# Patient Record
Sex: Female | Born: 1980 | Race: White | Hispanic: No | Marital: Married | Smoking: Light tobacco smoker
Health system: Southern US, Community
[De-identification: ages and names within clinical notes are randomized; demographics above are authoritative.]

## PROBLEM LIST (undated history)

## (undated) ENCOUNTER — Inpatient Hospital Stay: Payer: Self-pay

## (undated) DIAGNOSIS — F502 Bulimia nervosa, unspecified: Secondary | ICD-10-CM

## (undated) DIAGNOSIS — F1011 Alcohol abuse, in remission: Secondary | ICD-10-CM

## (undated) DIAGNOSIS — Z91410 Personal history of adult physical and sexual abuse: Secondary | ICD-10-CM

## (undated) DIAGNOSIS — R630 Anorexia: Secondary | ICD-10-CM

## (undated) DIAGNOSIS — Z8659 Personal history of other mental and behavioral disorders: Secondary | ICD-10-CM

## (undated) DIAGNOSIS — Z62810 Personal history of physical and sexual abuse in childhood: Secondary | ICD-10-CM

## (undated) DIAGNOSIS — K219 Gastro-esophageal reflux disease without esophagitis: Secondary | ICD-10-CM

## (undated) DIAGNOSIS — F429 Obsessive-compulsive disorder, unspecified: Secondary | ICD-10-CM

## (undated) DIAGNOSIS — R569 Unspecified convulsions: Secondary | ICD-10-CM

## (undated) DIAGNOSIS — D649 Anemia, unspecified: Secondary | ICD-10-CM

## (undated) DIAGNOSIS — Z8669 Personal history of other diseases of the nervous system and sense organs: Secondary | ICD-10-CM

## (undated) DIAGNOSIS — Z720 Tobacco use: Secondary | ICD-10-CM

## (undated) HISTORY — DX: Personal history of other diseases of the nervous system and sense organs: Z86.69

## (undated) HISTORY — PX: TONSILLECTOMY: SUR1361

## (undated) HISTORY — DX: Obsessive-compulsive disorder, unspecified: F42.9

## (undated) HISTORY — DX: Personal history of adult physical and sexual abuse: Z91.410

## (undated) HISTORY — PX: EYE SURGERY: SHX253

## (undated) HISTORY — DX: Personal history of physical and sexual abuse in childhood: Z62.810

## (undated) HISTORY — DX: Alcohol abuse, in remission: F10.11

## (undated) HISTORY — DX: Tobacco use: Z72.0

## (undated) HISTORY — DX: Personal history of other mental and behavioral disorders: Z86.59

---
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
## 2005-03-30 ENCOUNTER — Emergency Department: Payer: Self-pay | Admitting: Internal Medicine

## 2007-03-27 ENCOUNTER — Emergency Department: Payer: Self-pay | Admitting: Emergency Medicine

## 2007-03-29 ENCOUNTER — Ambulatory Visit: Payer: Self-pay | Admitting: Emergency Medicine

## 2008-04-06 ENCOUNTER — Observation Stay: Payer: Self-pay

## 2008-04-07 ENCOUNTER — Inpatient Hospital Stay: Payer: Self-pay

## 2008-05-27 ENCOUNTER — Emergency Department: Payer: Self-pay | Admitting: Emergency Medicine

## 2008-11-26 ENCOUNTER — Encounter: Payer: Self-pay | Admitting: Family Medicine

## 2008-12-02 ENCOUNTER — Encounter: Payer: Self-pay | Admitting: Family Medicine

## 2008-12-17 ENCOUNTER — Emergency Department: Payer: Self-pay | Admitting: Emergency Medicine

## 2009-01-01 ENCOUNTER — Encounter: Payer: Self-pay | Admitting: Family Medicine

## 2009-05-08 ENCOUNTER — Encounter: Payer: Self-pay | Admitting: Obstetrics & Gynecology

## 2009-05-22 ENCOUNTER — Encounter: Payer: Self-pay | Admitting: Obstetrics & Gynecology

## 2009-05-24 ENCOUNTER — Inpatient Hospital Stay: Payer: Self-pay | Admitting: Unknown Physician Specialty

## 2009-05-25 DIAGNOSIS — O0933 Supervision of pregnancy with insufficient antenatal care, third trimester: Secondary | ICD-10-CM

## 2009-07-24 ENCOUNTER — Ambulatory Visit: Payer: Self-pay | Admitting: Family Medicine

## 2011-07-11 ENCOUNTER — Other Ambulatory Visit: Payer: Self-pay

## 2011-07-19 ENCOUNTER — Observation Stay: Payer: Self-pay

## 2011-07-19 LAB — URINALYSIS, COMPLETE
Blood: NEGATIVE
Ketone: NEGATIVE
Leukocyte Esterase: NEGATIVE
Nitrite: POSITIVE
Ph: 6 (ref 4.5–8.0)
Protein: NEGATIVE
Specific Gravity: 1.01 (ref 1.003–1.030)
Squamous Epithelial: NONE SEEN

## 2011-07-19 LAB — DRUG SCREEN, URINE
Amphetamines, Ur Screen: NEGATIVE (ref ?–1000)
Barbiturates, Ur Screen: NEGATIVE (ref ?–200)
Benzodiazepine, Ur Scrn: NEGATIVE (ref ?–200)
Cannabinoid 50 Ng, Ur ~~LOC~~: NEGATIVE (ref ?–50)
Methadone, Ur Screen: NEGATIVE (ref ?–300)
Opiate, Ur Screen: NEGATIVE (ref ?–300)
Phencyclidine (PCP) Ur S: NEGATIVE (ref ?–25)

## 2011-09-01 ENCOUNTER — Inpatient Hospital Stay: Payer: Self-pay

## 2011-09-01 LAB — DRUG SCREEN, URINE
Barbiturates, Ur Screen: NEGATIVE (ref ?–200)
Cannabinoid 50 Ng, Ur ~~LOC~~: NEGATIVE (ref ?–50)
Cocaine Metabolite,Ur ~~LOC~~: NEGATIVE (ref ?–300)
Methadone, Ur Screen: NEGATIVE (ref ?–300)
Opiate, Ur Screen: NEGATIVE (ref ?–300)
Phencyclidine (PCP) Ur S: NEGATIVE (ref ?–25)

## 2011-09-01 LAB — CBC WITH DIFFERENTIAL/PLATELET
Basophil %: 0.3 %
HCT: 36.5 % (ref 35.0–47.0)
HGB: 12.9 g/dL (ref 12.0–16.0)
MCH: 32.9 pg (ref 26.0–34.0)
MCHC: 35.2 g/dL (ref 32.0–36.0)
MCV: 93 fL (ref 80–100)
Monocyte %: 5.2 %
Neutrophil #: 7.4 10*3/uL — ABNORMAL HIGH (ref 1.4–6.5)
Platelet: 126 10*3/uL — ABNORMAL LOW (ref 150–440)
RDW: 13.9 % (ref 11.5–14.5)

## 2011-09-02 LAB — HEMATOCRIT: HCT: 35.1 % (ref 35.0–47.0)

## 2012-02-02 DIAGNOSIS — R569 Unspecified convulsions: Secondary | ICD-10-CM

## 2012-02-02 HISTORY — DX: Unspecified convulsions: R56.9

## 2012-06-30 ENCOUNTER — Emergency Department: Payer: Self-pay | Admitting: Emergency Medicine

## 2012-07-02 ENCOUNTER — Emergency Department: Payer: Self-pay | Admitting: Emergency Medicine

## 2012-12-10 ENCOUNTER — Emergency Department: Payer: Self-pay | Admitting: Emergency Medicine

## 2012-12-10 LAB — COMPREHENSIVE METABOLIC PANEL
Anion Gap: 4 — ABNORMAL LOW (ref 7–16)
BUN: 5 mg/dL — ABNORMAL LOW (ref 7–18)
Bilirubin,Total: 0.6 mg/dL (ref 0.2–1.0)
Calcium, Total: 8.9 mg/dL (ref 8.5–10.1)
EGFR (Non-African Amer.): >60
Osmolality: 271 (ref 275–301)
Potassium: 3.5 mmol/L (ref 3.5–5.1)
SGOT(AST): 142 U/L — ABNORMAL HIGH (ref 15–37)
SGPT (ALT): 342 U/L — ABNORMAL HIGH (ref 12–78)

## 2012-12-10 LAB — URINALYSIS, COMPLETE
Glucose,UR: NEGATIVE mg/dL (ref 0–75)
Ketone: NEGATIVE
Leukocyte Esterase: NEGATIVE
Ph: 6 (ref 4.5–8.0)
Renal Epithelial: 2
Specific Gravity: 1.008 (ref 1.003–1.030)
Squamous Epithelial: 1
WBC UR: 1 /HPF (ref 0–5)

## 2012-12-10 LAB — CBC
HCT: 39.3 % (ref 35.0–47.0)
HGB: 13.8 g/dL (ref 12.0–16.0)
MCH: 34.2 pg — ABNORMAL HIGH (ref 26.0–34.0)
MCHC: 35.2 g/dL (ref 32.0–36.0)
RBC: 4.05 10*6/uL (ref 3.80–5.20)
RDW: 13.4 % (ref 11.5–14.5)

## 2013-08-20 ENCOUNTER — Emergency Department: Payer: Self-pay | Admitting: Emergency Medicine

## 2013-10-30 ENCOUNTER — Emergency Department: Payer: Self-pay | Admitting: Emergency Medicine

## 2013-10-30 LAB — URINALYSIS, COMPLETE
BILIRUBIN, UR: NEGATIVE
Glucose,UR: NEGATIVE mg/dL (ref 0–75)
KETONE: NEGATIVE
LEUKOCYTE ESTERASE: NEGATIVE
Nitrite: NEGATIVE
Ph: 6 (ref 4.5–8.0)
Protein: 30
RBC,UR: 15 /HPF (ref 0–5)
SPECIFIC GRAVITY: 1.001 (ref 1.003–1.030)
Squamous Epithelial: 101
WBC UR: 9 /HPF (ref 0–5)

## 2013-11-01 LAB — URINE CULTURE

## 2014-04-19 ENCOUNTER — Emergency Department: Payer: Self-pay | Admitting: Emergency Medicine

## 2014-05-21 NOTE — Consult Note (Signed)
    Maternal Age 34    Gravida 5    Para 3    Term Deliveries 3    Blood Type (Maternal) A positive    HIV Results (Maternal) negative    Gonorrhea Results (Maternal) negative    Chlamydia Results (Maternal) negative    Hepatitis C Culture (Maternal) unknown    Herpes Results (Maternal) n/a    VDRL/RPR/Syphilis Results (Maternal) negative    Rubella Results (Maternal) immune    Hepatitis B Surface Antigen Results (Maternal) negative    Group B Strep Results Maternal (Result >5wks must be treated as unknown) positive     Additional Comments I was consulted by OB to consult on this mother for concerns of Suboxone use during pregnancy and potential effect on the baby.  I review the medical records, and assessed mom and dad's understanding of why they are talking with a neonatologist.  Mom reports that she is taking 12mg  PO BID and not on any other prescription medications, or have any other complications with her pregnancy.  The medical record and repeated urine toxicology screen confirm this.  I discussed with the the pros and cons of pharmacotherapy during pregnancy for concerns of opiate addiction.  I explained to them the possibel long term risk of neurologic and behavioral issues associated with in-utero narcotic exposure.  I also discussed the acute complications of in-utero opiate exposure, primarily neonatal abstinence syndrome.  As mom is on a moderate dose of Suboxone, I estimated a 50/50 chance of clinically significant withdrawal symptoms by day 4-5 of life that may require pharmacologic management.  I informed her that due to Suboxone's long half-life, we will need to monitor her son for upto 5 days to assess for NAS.  I further expressed to her, that for his safety, he may best be monitored in the [facility].  As mom desires to breastfeed, she understands that this may not be ideal, but may be best for her son.  I did confirm that the Suboxone is not a contraindication to  breastfeeding.  She will try her best to be avilable or provide breast milk, as available, to nurish her son.  I expressed that if her son does need medical management for NAS, [facility].  I answered their questions.  They expressed appreciation for the consultation.  I spent 60 minutes reviwing the chart, talking with providers, and face-to-face with family.  Thank you very much for allowing me to particiapte in her care, please let me know if we can be of any additional service.    Parental Contact Parents informed at length regarding prenatal care and plan   Electronic Signatures: Torrie MayersHorowitz, Shelle Galdamez N (MD)  (Signed 31-Jul-13 13:51)  Authored: PREGNANCY and LABOR, ADDITIONAL COMMENTS   Last Updated: 31-Jul-13 13:51 by Torrie MayersHorowitz, Tukker Byrns N (MD)

## 2014-06-11 NOTE — H&P (Signed)
L&D Evaluation:  History:   HPI 34 yo G5P3 (1 set of twins) with unknown LMp approx 10/2010 [facility] here due to "low pelvic pain today", 3 episodes of diarrhea, +cramping like discomfort. No LOF, VB, decreased FM or active labor pattern. [facility]. Pt is on Suboxone and in the [facility] x 2 years for addiciton of narcs related to her back problems.    Presents with abdominal pain, lower pelvic pain, diarrhea    Patient's Medical History Drug Addiction on Suboxone, PP depression,    Patient's Surgical History none    Medications Pre Natal Vitamins  Iron    Allergies NKDA    Social History tobacco  EtOH  Last ETOH 2012, Quit tobacco 9 months ago    Family History Non-Contributory   ROS:   ROS All systems were reviewed.  HEENT, CNS, GI, GU, Respiratory, CV, Renal and Musculoskeletal systems were found to be normal.   Exam:   Vital Signs stable  109/70    Urine Protein UDS neg, Urine 1+ bact, neg leuk esterase, +nitirites    General no apparent distress    Mental Status clear    Chest clear    Heart no murmur/gallop/rubs    Abdomen gravid, non-tender    Estimated Fetal Weight Average for gestational age    Back no CVAT    Edema 1+    Reflexes 1+    Clonus negative    Mebranes Intact    FHT normal rate with no decels, reactive NST with 2 accels 15 x 15 bpm    Ucx irregular    Skin dry    Lymph no lymphadenopathy    Other Pt ate and tol without diarrhea after Immodium given. pt lives in a shelter currently. [facility] Timmothy Soursebbie Stanford, CNM   Impression:   Impression UTI, reactive NST, IUP at 32 weeks   Plan:   Plan Dc home, [facility] and [facility]   Electronic Signatures: Sharee PimpleJones, Caron W (CNM)  (Signed 17-Jun-13 21:26)  Authored: L&D Evaluation   Last Updated: 17-Jun-13 21:26 by Sharee PimpleJones, Caron W (CNM)

## 2014-06-11 NOTE — H&P (Signed)
L&D Evaluation:  History:   HPI 34 y/o G5P3013 [facility] by [facility]JS for FHR baseline 100's. Irreg mild uc's, regular fetal movement, denies leaking fluid or vaginal bleeding. [facility] at [redacted]wks gestation.Pregnancy complicated by HX of substance abuse-opiod dependence since 2008. Current use of suboxone (prior 7063yrs) 24mg QD (12mg  am and 12pm dose). HX PPD 2nd child (multiple medication and multiple stressors).GBS+    Presents with above    Patient's Medical History No Chronic Illness    Patient's Surgical History none    Allergies NKDA    Social History ETOH and cigarettes prior to pregnancy    Family History Non-Contributory   ROS:   ROS All systems were reviewed.  HEENT, CNS, GI, GU, Respiratory, CV, Renal and Musculoskeletal systems were found to be normal.   Exam:   Vital Signs stable    Urine Protein to lab    General no apparent distress    Mental Status clear    Chest clear    Heart normal sinus rhythm    Abdomen gravid, non-tender    Estimated Fetal Weight Average for gestational age    Fetal Position vtx    Fundal Height term    Back no CVAT    Edema no edema    Reflexes 1+    Clonus negative    Pelvic no external lesions, 4-5cm 80% BBOW sm show vtx @ -1    Mebranes Intact    FHT baseline 100's avg variability with accels to 110-120's    FHT Description 120    Ucx irregular    Skin dry    Lymph no lymphadenopathy   Impression:   Impression early labor, Augment early labor/ Low FHR baseline   Plan:   Plan EFM/NST, monitor contractions and for cervical change, antibiotics for GBBS prophylaxis    Comments Admitted, explained what to expect with labor/delivery/postpartum and newborn care. Has taken 1200 dose of 12mg  suboxone. IV ABX infusing, will await for AROM/Pitocin augment. Plans epidural with labor progress. Dr Feliberto GottronSchermerhorn updated on current assessment.  Neonatology consulted and Dr Abundio MiuHorowitz has been to see pt and husband Reuel BoomDaniel @ bedside..Will order social services consult. Continue close observation per CNM.   Electronic Signatures: Albertina ParrLugiano, Treyveon Mochizuki B (CNM)  (Signed 31-Jul-13 13:32)  Authored: L&D Evaluation   Last Updated: 31-Jul-13 13:32 by Albertina ParrLugiano, Talaysha Freeberg B (CNM)

## 2014-07-30 ENCOUNTER — Other Ambulatory Visit: Payer: Self-pay | Admitting: Advanced Practice Midwife

## 2014-07-30 DIAGNOSIS — F17200 Nicotine dependence, unspecified, uncomplicated: Secondary | ICD-10-CM

## 2014-07-30 DIAGNOSIS — F1010 Alcohol abuse, uncomplicated: Secondary | ICD-10-CM

## 2014-07-31 LAB — OB RESULTS CONSOLE GC/CHLAMYDIA
CHLAMYDIA, DNA PROBE: NEGATIVE
Gonorrhea: NEGATIVE

## 2014-07-31 LAB — OB RESULTS CONSOLE VARICELLA ZOSTER ANTIBODY, IGG: VARICELLA IGG: IMMUNE

## 2014-07-31 LAB — OB RESULTS CONSOLE RUBELLA ANTIBODY, IGM: RUBELLA: IMMUNE

## 2014-07-31 LAB — OB RESULTS CONSOLE HGB/HCT, BLOOD
HCT: 38 %
Hemoglobin: 12.7 g/dL

## 2014-07-31 LAB — OB RESULTS CONSOLE HIV ANTIBODY (ROUTINE TESTING): HIV: NONREACTIVE

## 2014-07-31 LAB — OB RESULTS CONSOLE ABO/RH: RH Type: POSITIVE

## 2014-07-31 LAB — OB RESULTS CONSOLE RPR: RPR: NONREACTIVE

## 2014-07-31 LAB — OB RESULTS CONSOLE HEPATITIS B SURFACE ANTIGEN: HEP B S AG: NEGATIVE

## 2014-07-31 LAB — OB RESULTS CONSOLE ANTIBODY SCREEN: ANTIBODY SCREEN: NEGATIVE

## 2014-07-31 LAB — OB RESULTS CONSOLE PLATELET COUNT: Platelets: 178 10*3/uL

## 2014-08-22 ENCOUNTER — Ambulatory Visit
Admission: RE | Admit: 2014-08-22 | Discharge: 2014-08-22 | Disposition: A | Discharge status: Home / Self Care | Payer: Medicaid Other | Source: Ambulatory Visit | Attending: Maternal & Fetal Medicine | Admitting: Maternal & Fetal Medicine

## 2014-08-22 DIAGNOSIS — O99332 Smoking (tobacco) complicating pregnancy, second trimester: Secondary | ICD-10-CM | POA: Diagnosis not present

## 2014-08-22 DIAGNOSIS — F17200 Nicotine dependence, unspecified, uncomplicated: Secondary | ICD-10-CM

## 2014-08-22 DIAGNOSIS — F1010 Alcohol abuse, uncomplicated: Secondary | ICD-10-CM

## 2014-08-22 DIAGNOSIS — Z3A16 16 weeks gestation of pregnancy: Secondary | ICD-10-CM | POA: Diagnosis not present

## 2014-08-22 HISTORY — DX: Bulimia nervosa, unspecified: F50.20

## 2014-08-22 HISTORY — DX: Anorexia: R63.0

## 2014-08-22 HISTORY — DX: Bulimia nervosa: F50.2

## 2014-08-22 LAB — US OB DETAIL + 14 WK

## 2014-08-27 ENCOUNTER — Encounter: Payer: Self-pay | Admitting: Obstetrics and Gynecology

## 2014-09-05 ENCOUNTER — Inpatient Hospital Stay: Admission: RE | Admit: 2014-09-05 | Payer: Medicaid Other | Source: Ambulatory Visit

## 2014-09-18 ENCOUNTER — Encounter: Payer: Medicaid Other | Admitting: Obstetrics and Gynecology

## 2014-09-19 ENCOUNTER — Ambulatory Visit (INDEPENDENT_AMBULATORY_CARE_PROVIDER_SITE_OTHER): Payer: Medicaid Other | Admitting: Obstetrics and Gynecology

## 2014-09-19 ENCOUNTER — Encounter: Payer: Self-pay | Admitting: Obstetrics and Gynecology

## 2014-09-19 VITALS — BP 116/80 | HR 60 | Wt 139.9 lb

## 2014-09-19 DIAGNOSIS — F5000 Anorexia nervosa, unspecified: Secondary | ICD-10-CM | POA: Insufficient documentation

## 2014-09-19 DIAGNOSIS — Z8679 Personal history of other diseases of the circulatory system: Secondary | ICD-10-CM

## 2014-09-19 DIAGNOSIS — O219 Vomiting of pregnancy, unspecified: Secondary | ICD-10-CM | POA: Insufficient documentation

## 2014-09-19 DIAGNOSIS — O0992 Supervision of high risk pregnancy, unspecified, second trimester: Secondary | ICD-10-CM

## 2014-09-19 DIAGNOSIS — O9989 Other specified diseases and conditions complicating pregnancy, childbirth and the puerperium: Secondary | ICD-10-CM

## 2014-09-19 DIAGNOSIS — F42 Obsessive-compulsive disorder: Secondary | ICD-10-CM

## 2014-09-19 DIAGNOSIS — Z720 Tobacco use: Secondary | ICD-10-CM

## 2014-09-19 DIAGNOSIS — F429 Obsessive-compulsive disorder, unspecified: Secondary | ICD-10-CM

## 2014-09-19 DIAGNOSIS — Z62810 Personal history of physical and sexual abuse in childhood: Secondary | ICD-10-CM

## 2014-09-19 DIAGNOSIS — F1210 Cannabis abuse, uncomplicated: Secondary | ICD-10-CM

## 2014-09-19 DIAGNOSIS — Z315 Encounter for genetic counseling: Secondary | ICD-10-CM

## 2014-09-19 DIAGNOSIS — F1010 Alcohol abuse, uncomplicated: Secondary | ICD-10-CM

## 2014-09-19 DIAGNOSIS — F1011 Alcohol abuse, in remission: Secondary | ICD-10-CM | POA: Insufficient documentation

## 2014-09-19 DIAGNOSIS — Z91410 Personal history of adult physical and sexual abuse: Secondary | ICD-10-CM

## 2014-09-19 DIAGNOSIS — O0990 Supervision of high risk pregnancy, unspecified, unspecified trimester: Secondary | ICD-10-CM | POA: Insufficient documentation

## 2014-09-19 DIAGNOSIS — IMO0002 Reserved for concepts with insufficient information to code with codable children: Secondary | ICD-10-CM

## 2014-09-19 DIAGNOSIS — Z8669 Personal history of other diseases of the nervous system and sense organs: Secondary | ICD-10-CM

## 2014-09-19 DIAGNOSIS — Z331 Pregnant state, incidental: Secondary | ICD-10-CM

## 2014-09-19 DIAGNOSIS — Z8659 Personal history of other mental and behavioral disorders: Secondary | ICD-10-CM

## 2014-09-19 HISTORY — DX: Personal history of other mental and behavioral disorders: Z86.59

## 2014-09-19 HISTORY — DX: Personal history of other diseases of the nervous system and sense organs: Z86.69

## 2014-09-19 HISTORY — DX: Personal history of physical and sexual abuse in childhood: Z62.810

## 2014-09-19 HISTORY — DX: Tobacco use: Z72.0

## 2014-09-19 HISTORY — DX: Alcohol abuse, in remission: F10.11

## 2014-09-19 HISTORY — DX: Personal history of adult physical and sexual abuse: Z91.410

## 2014-09-19 HISTORY — DX: Obsessive-compulsive disorder, unspecified: F42.9

## 2014-09-19 LAB — POCT URINALYSIS DIPSTICK
Bilirubin, UA: NEGATIVE
Blood, UA: NEGATIVE
GLUCOSE UA: NEGATIVE
KETONES UA: NEGATIVE
Leukocytes, UA: NEGATIVE
Nitrite, UA: NEGATIVE
Protein, UA: NEGATIVE
SPEC GRAV UA: 1.015
UROBILINOGEN UA: 0.2
pH, UA: 6.5

## 2014-09-19 MED ORDER — ONDANSETRON 4 MG PO TBDP: 4.0000 mg | ORAL_TABLET | Freq: Four times a day (QID) | ORAL | Status: AC | PRN

## 2014-09-19 MED ORDER — DOXYLAMINE-PYRIDOXINE 10-10 MG PO TBEC: 2.0000 | DELAYED_RELEASE_TABLET | Freq: Every day | ORAL | Status: DC
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
## 2014-09-25 ENCOUNTER — Other Ambulatory Visit: Payer: Medicaid Other

## 2014-09-25 LAB — AFP, QUAD SCREEN
DIA MOM VALUE: 1.19
DIA VALUE (EIA): 261.12 pg/mL
DSR (By Age)    1 IN: 436
DSR (Second Trimester) 1 IN: 5015
GESTATIONAL AGE AFP: 20.3 wk
MATERNAL AGE AT EDD: 33.1 a
MSAFP Mom: 1.4
MSAFP: 84.9 ng/mL
MSHCG Mom: 0.73
MSHCG: 17948 m[IU]/mL
Osb Risk: 3600
T18 (By Age): 1:1698 {titer}
Test Results:: NEGATIVE
UE3 MOM: 1.31
Weight: 139 [lb_av]
uE3 Value: 2.71 ng/mL

## 2014-09-30 ENCOUNTER — Telehealth: Payer: Self-pay

## 2014-09-30 NOTE — Telephone Encounter (Signed)
-----   Message from Hildred Laser, MD sent at 09/26/2014 11:34 AM EDT ----- Please inform patient of normal 2nd trimester screen

## 2014-09-30 NOTE — Telephone Encounter (Signed)
Pt notifed.

## 2014-10-11 ENCOUNTER — Ambulatory Visit: Payer: Medicaid Other

## 2014-10-11 DIAGNOSIS — IMO0002 Reserved for concepts with insufficient information to code with codable children: Secondary | ICD-10-CM

## 2014-10-11 DIAGNOSIS — Z315 Encounter for genetic counseling: Secondary | ICD-10-CM

## 2014-10-24 ENCOUNTER — Encounter: Payer: Medicaid Other | Admitting: Obstetrics and Gynecology

## 2014-10-29 ENCOUNTER — Observation Stay
Admission: EM | Admit: 2014-10-29 | Discharge: 2014-10-29 | Disposition: A | Discharge status: Home / Self Care | Payer: Medicaid Other | Attending: Obstetrics and Gynecology | Admitting: Obstetrics and Gynecology

## 2014-10-29 ENCOUNTER — Encounter: Payer: Medicaid Other | Admitting: Obstetrics and Gynecology

## 2014-10-29 DIAGNOSIS — Z3A26 26 weeks gestation of pregnancy: Secondary | ICD-10-CM | POA: Insufficient documentation

## 2014-10-29 LAB — URINALYSIS COMPLETE WITH MICROSCOPIC (ARMC ONLY)
BILIRUBIN URINE: NEGATIVE
Bacteria, UA: NONE SEEN
GLUCOSE, UA: NEGATIVE mg/dL
Ketones, ur: NEGATIVE mg/dL
Leukocytes, UA: NEGATIVE
Nitrite: NEGATIVE
PH: 5 (ref 5.0–8.0)
Protein, ur: NEGATIVE mg/dL
Specific Gravity, Urine: 1.019 (ref 1.005–1.030)

## 2014-10-29 MED ORDER — ONDANSETRON HCL 4 MG PO TABS
4.0000 mg | ORAL_TABLET | Freq: Once | ORAL | Status: AC
  Administered 2014-10-29: 4 mg via ORAL

## 2014-10-29 MED ORDER — ONDANSETRON HCL 4 MG PO TABS
ORAL_TABLET | ORAL | Status: AC
  Administered 2014-10-29: 4 mg via ORAL
  Filled 2014-10-29: qty 1

## 2014-10-29 MED ORDER — BISMUTH SUBSALICYLATE 262 MG/15ML PO SUSP
30.0000 mL | Freq: Once | ORAL | Status: AC
  Administered 2014-10-29: 30 mL via ORAL
  Filled 2014-10-29: qty 118

## 2014-10-29 NOTE — OB Triage Note (Signed)
Pt. reports sharp lower abdominal pain radiating "from the sides;" has been having irregular contractions. Reports nausea, vomiting, and diarrhea beginning yesterday and worsening today.

## 2014-11-01 ENCOUNTER — Emergency Department
Admission: EM | Admit: 2014-11-01 | Discharge: 2014-11-01 | Disposition: A | Discharge status: Home / Self Care | Payer: Medicaid Other | Attending: Student | Admitting: Student

## 2014-11-01 ENCOUNTER — Encounter: Payer: Self-pay | Admitting: *Deleted

## 2014-11-01 DIAGNOSIS — L02413 Cutaneous abscess of right upper limb: Secondary | ICD-10-CM | POA: Diagnosis not present

## 2014-11-01 DIAGNOSIS — F17210 Nicotine dependence, cigarettes, uncomplicated: Secondary | ICD-10-CM | POA: Insufficient documentation

## 2014-11-01 DIAGNOSIS — O99712 Diseases of the skin and subcutaneous tissue complicating pregnancy, second trimester: Secondary | ICD-10-CM | POA: Diagnosis present

## 2014-11-01 DIAGNOSIS — L03113 Cellulitis of right upper limb: Secondary | ICD-10-CM

## 2014-11-01 DIAGNOSIS — Z79899 Other long term (current) drug therapy: Secondary | ICD-10-CM | POA: Diagnosis not present

## 2014-11-01 DIAGNOSIS — Z3A27 27 weeks gestation of pregnancy: Secondary | ICD-10-CM | POA: Diagnosis not present

## 2014-11-01 DIAGNOSIS — O99332 Smoking (tobacco) complicating pregnancy, second trimester: Secondary | ICD-10-CM | POA: Insufficient documentation

## 2014-11-01 MED ORDER — CLINDAMYCIN HCL 150 MG PO CAPS: 300.0000 mg | ORAL_CAPSULE | Freq: Four times a day (QID) | ORAL | Status: AC

## 2014-11-01 MED ORDER — CLINDAMYCIN HCL 150 MG PO CAPS
300.0000 mg | ORAL_CAPSULE | Freq: Once | ORAL | Status: AC
  Administered 2014-11-01: 300 mg via ORAL
  Filled 2014-11-01: qty 2
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
## 2014-11-01 NOTE — ED Notes (Signed)
Pt [redacted] weeks pregnant.

## 2014-11-01 NOTE — ED Notes (Signed)
Pt reports boil to left arm, had drained at [facility] last night, but pt unable to afford antibiotics.

## 2014-11-01 NOTE — ED Notes (Signed)
Pt seen and assessed by provider see provider notes for full details. 

## 2014-11-01 NOTE — Discharge Instructions (Signed)

## 2014-11-01 NOTE — ED Notes (Signed)
AAOx3.  Skin warm and dry.  NAD 

## 2014-11-07 ENCOUNTER — Ambulatory Visit (INDEPENDENT_AMBULATORY_CARE_PROVIDER_SITE_OTHER): Payer: Medicaid Other | Admitting: Obstetrics and Gynecology

## 2014-11-07 VITALS — BP 109/74 | HR 91 | Temp 98.4°F | Wt 146.3 lb

## 2014-11-07 DIAGNOSIS — O219 Vomiting of pregnancy, unspecified: Secondary | ICD-10-CM

## 2014-11-07 DIAGNOSIS — Z23 Encounter for immunization: Secondary | ICD-10-CM | POA: Diagnosis not present

## 2014-11-07 DIAGNOSIS — O0992 Supervision of high risk pregnancy, unspecified, second trimester: Secondary | ICD-10-CM

## 2014-11-07 LAB — POCT URINALYSIS DIP (MANUAL ENTRY)
BILIRUBIN UA: NEGATIVE
BILIRUBIN UA: NEGATIVE
GLUCOSE UA: NEGATIVE
Leukocytes, UA: NEGATIVE
NITRITE UA: NEGATIVE
PH UA: 6
Protein Ur, POC: NEGATIVE
RBC UA: NEGATIVE
SPEC GRAV UA: 1.015
Urobilinogen, UA: 0.2

## 2014-11-07 NOTE — Progress Notes (Signed)
ROB: Patient has complaints today of stomach pain. She needs a refill on Zofran, pre natal vitamins.

## 2014-11-07 NOTE — Progress Notes (Signed)
ROB: Patient still noting nausea and vomiting.  Desires refill on Zofran.  Refill given. Does notes some uterine irritability.  Advised on increasing hydration as much as tolerted.  RTC in 2 weeks.  For glucola at that time.  Also for flu vaccine next visit.

## 2014-11-18 ENCOUNTER — Telehealth: Payer: Self-pay

## 2014-11-18 ENCOUNTER — Encounter: Payer: Self-pay | Admitting: Obstetrics and Gynecology

## 2014-11-18 NOTE — Telephone Encounter (Signed)
Pt calls stating her lower extremeties: legs, ankles and feet are very swollen, face (cheeks) swollen and hands slightly swollen. No c/o headache, n/v. Has a little dizziness. Was seeing spots last night.  Pt to come in at 11:00am today for appt.

## 2014-11-21 ENCOUNTER — Other Ambulatory Visit: Payer: Medicaid Other

## 2014-11-21 ENCOUNTER — Encounter: Payer: Medicaid Other | Admitting: Obstetrics and Gynecology

## 2014-11-21 ENCOUNTER — Ambulatory Visit (INDEPENDENT_AMBULATORY_CARE_PROVIDER_SITE_OTHER): Payer: Medicaid Other | Admitting: Obstetrics and Gynecology

## 2014-11-21 VITALS — BP 107/65 | HR 102 | Wt 147.9 lb

## 2014-11-21 DIAGNOSIS — O219 Vomiting of pregnancy, unspecified: Secondary | ICD-10-CM

## 2014-11-21 DIAGNOSIS — O0992 Supervision of high risk pregnancy, unspecified, second trimester: Secondary | ICD-10-CM

## 2014-11-21 LAB — POCT URINALYSIS DIP (MANUAL ENTRY)
BILIRUBIN UA: NEGATIVE
BILIRUBIN UA: NEGATIVE
Blood, UA: NEGATIVE
GLUCOSE UA: NEGATIVE
LEUKOCYTES UA: NEGATIVE
NITRITE UA: NEGATIVE
PH UA: 7
Protein Ur, POC: NEGATIVE
Spec Grav, UA: <=1.005
Urobilinogen, UA: 0.2

## 2014-11-21 NOTE — Progress Notes (Signed)
ROB: Denies complaints.  Still has nausea/vomiting, but controlled with Zofran.  Arrived late, so could not complete 1 hr glucola today.  Rescheduled for next week.  RTC in 2 weeks. Declines flu vaccine.

## 2014-11-22 ENCOUNTER — Other Ambulatory Visit: Payer: Self-pay

## 2014-11-22 MED ORDER — PRENATAL VITAMINS 0.8 MG PO TABS: 1.0000 | ORAL_TABLET | Freq: Every day | ORAL | Status: DC

## 2014-11-22 MED ORDER — DOXYLAMINE SUCCINATE (SLEEP) 25 MG PO TABS: 12.5000 mg | ORAL_TABLET | Freq: Every evening | ORAL | Status: DC | PRN

## 2014-11-22 MED ORDER — VITAMIN B-6 25 MG PO TABS: 25.0000 mg | ORAL_TABLET | Freq: Every day | ORAL | Status: DC

## 2014-11-22 NOTE — Telephone Encounter (Signed)
Pt's medicaid will not cover diclegis- will erx unisom and b6. Pt also wanted a rx for pnv- .done.

## 2014-11-26 ENCOUNTER — Other Ambulatory Visit: Payer: Medicaid Other

## 2014-12-04 ENCOUNTER — Ambulatory Visit (INDEPENDENT_AMBULATORY_CARE_PROVIDER_SITE_OTHER): Payer: Medicaid Other | Admitting: Obstetrics and Gynecology

## 2014-12-04 VITALS — BP 94/57 | HR 123 | Wt 149.7 lb

## 2014-12-04 DIAGNOSIS — O0933 Supervision of pregnancy with insufficient antenatal care, third trimester: Secondary | ICD-10-CM | POA: Diagnosis not present

## 2014-12-04 DIAGNOSIS — Z23 Encounter for immunization: Secondary | ICD-10-CM

## 2014-12-04 DIAGNOSIS — Z0189 Encounter for other specified special examinations: Secondary | ICD-10-CM

## 2014-12-04 DIAGNOSIS — Z0283 Encounter for blood-alcohol and blood-drug test: Secondary | ICD-10-CM

## 2014-12-04 LAB — POCT URINALYSIS DIPSTICK
Bilirubin, UA: NEGATIVE
GLUCOSE UA: NEGATIVE
KETONES UA: NEGATIVE
Nitrite, UA: NEGATIVE
PROTEIN UA: NEGATIVE
RBC UA: NEGATIVE
SPEC GRAV UA: 1.015
UROBILINOGEN UA: NEGATIVE
pH, UA: 7.5

## 2014-12-04 MED ORDER — PRENATAL VITAMINS 0.8 MG PO TABS: 1.0000 | ORAL_TABLET | Freq: Every day | ORAL | Status: AC

## 2014-12-04 NOTE — Progress Notes (Signed)
ROB: Reports complaints of Braxton Hicks. Patient with frazzled, slightly disheveled appearance, unable to concentrate for lengthy period of time, with diaphoresis.  Will order UDS.  Arrived late again, could not complete glucola. Plans to perform in 2 days.  Now desires flu vaccine (previously declined), will administer  RTC in 2 weeks for next visit.

## 2014-12-06 ENCOUNTER — Other Ambulatory Visit: Payer: Medicaid Other

## 2014-12-07 LAB — PAIN MGT SCRN (14 DRUGS), UR
Amphetamine Screen, Ur: NEGATIVE ng/mL
BARBITURATE SCRN UR: NEGATIVE ng/mL
BUPRENORPHINE, URINE: NEGATIVE ng/mL
Benzodiazepine Screen, Urine: NEGATIVE ng/mL
CREATININE(CRT), U: 95.4 mg/dL (ref 20.0–300.0)
Cannabinoids Ur Ql Scn: NEGATIVE ng/mL
Cocaine(Metab.)Screen, Urine: NEGATIVE ng/mL
Fentanyl, Urine: NEGATIVE pg/mL
METHADONE SCREEN, URINE: NEGATIVE ng/mL
Meperidine Screen, Urine: NEGATIVE ng/mL
Opiate Scrn, Ur: NEGATIVE ng/mL
Oxycodone+Oxymorphone Ur Ql Scn: NEGATIVE ng/mL
PCP Scrn, Ur: NEGATIVE ng/mL
PH UR, DRUG SCRN: 7.2 (ref 4.5–8.9)
Propoxyphene, Screen: NEGATIVE ng/mL
Tramadol Ur Ql Scn: NEGATIVE ng/mL

## 2014-12-07 LAB — NICOTINE SCREEN, URINE: COTININE UR QL SCN: NEGATIVE ng/mL

## 2014-12-13 ENCOUNTER — Other Ambulatory Visit: Payer: Medicaid Other

## 2014-12-13 DIAGNOSIS — O0992 Supervision of high risk pregnancy, unspecified, second trimester: Secondary | ICD-10-CM

## 2014-12-14 LAB — HEMOGLOBIN AND HEMATOCRIT, BLOOD
HEMOGLOBIN: 10.9 g/dL — AB (ref 11.1–15.9)
Hematocrit: 32.1 % — ABNORMAL LOW (ref 34.0–46.6)

## 2014-12-14 LAB — GLUCOSE, 1 HOUR GESTATIONAL: Gestational Diabetes Screen: 76 mg/dL (ref 65–139)

## 2014-12-19 ENCOUNTER — Encounter: Payer: Medicaid Other | Admitting: Obstetrics and Gynecology

## 2014-12-24 ENCOUNTER — Encounter: Payer: Medicaid Other | Admitting: Obstetrics and Gynecology

## 2014-12-31 ENCOUNTER — Ambulatory Visit (INDEPENDENT_AMBULATORY_CARE_PROVIDER_SITE_OTHER): Payer: Medicaid Other | Admitting: Obstetrics and Gynecology

## 2014-12-31 VITALS — BP 97/61 | HR 100 | Wt 152.0 lb

## 2014-12-31 DIAGNOSIS — Z36 Encounter for antenatal screening of mother: Secondary | ICD-10-CM

## 2014-12-31 DIAGNOSIS — Z3483 Encounter for supervision of other normal pregnancy, third trimester: Secondary | ICD-10-CM

## 2014-12-31 DIAGNOSIS — Z3493 Encounter for supervision of normal pregnancy, unspecified, third trimester: Secondary | ICD-10-CM

## 2014-12-31 DIAGNOSIS — Z113 Encounter for screening for infections with a predominantly sexual mode of transmission: Secondary | ICD-10-CM

## 2014-12-31 DIAGNOSIS — Z369 Encounter for antenatal screening, unspecified: Secondary | ICD-10-CM

## 2014-12-31 LAB — POCT URINALYSIS DIPSTICK
Bilirubin, UA: NEGATIVE
Blood, UA: NEGATIVE
GLUCOSE UA: NEGATIVE
Ketones, UA: NEGATIVE
LEUKOCYTES UA: NEGATIVE
NITRITE UA: NEGATIVE
PROTEIN UA: NEGATIVE
SPEC GRAV UA: 1.02
UROBILINOGEN UA: NEGATIVE
pH, UA: 6

## 2014-12-31 NOTE — Progress Notes (Signed)
ROB: Patient notes that she was having spotting approximately 1-2 weeks ago.  Denies passage of clots.  Also notes contractions.  36 week labs done today. Cervix with external os 3 cm, but internal closed Glucola normal.  RTC in 1 week.  PTL precautions given.

## 2015-01-01 NOTE — Addendum Note (Signed)
Addended by: Debbe BalesGLANTON, Lynda Capistran C on: 01/01/2015 12:16 PM   Modules accepted: Orders

## 2015-01-03 LAB — GC/CHLAMYDIA PROBE AMP
Chlamydia trachomatis, NAA: NEGATIVE
NEISSERIA GONORRHOEAE BY PCR: NEGATIVE

## 2015-01-05 LAB — CULTURE, BETA STREP (GROUP B ONLY): Strep Gp B Culture: NEGATIVE

## 2015-01-09 ENCOUNTER — Ambulatory Visit (INDEPENDENT_AMBULATORY_CARE_PROVIDER_SITE_OTHER): Payer: Medicaid Other | Admitting: Obstetrics and Gynecology

## 2015-01-09 VITALS — BP 112/73 | HR 114 | Wt 152.4 lb

## 2015-01-09 DIAGNOSIS — Z720 Tobacco use: Secondary | ICD-10-CM

## 2015-01-09 DIAGNOSIS — Z3483 Encounter for supervision of other normal pregnancy, third trimester: Secondary | ICD-10-CM

## 2015-01-09 DIAGNOSIS — O219 Vomiting of pregnancy, unspecified: Secondary | ICD-10-CM

## 2015-01-09 DIAGNOSIS — Z3493 Encounter for supervision of normal pregnancy, unspecified, third trimester: Secondary | ICD-10-CM

## 2015-01-09 LAB — POCT URINALYSIS DIPSTICK
Bilirubin, UA: NEGATIVE
Blood, UA: NEGATIVE
Glucose, UA: NEGATIVE
Ketones, UA: NEGATIVE
LEUKOCYTES UA: NEGATIVE
NITRITE UA: NEGATIVE
PH UA: 6.5
PROTEIN UA: NEGATIVE
Spec Grav, UA: 1.015
Urobilinogen, UA: NEGATIVE

## 2015-01-09 NOTE — Progress Notes (Signed)
ROB: Patient denies major complaints.  36 week labs negative.  PTL precautions given.  36 week labs negative. Notes that she needs a refill on Zofran due to vomiting. Refill ordered. RTC in 1 week.

## 2015-01-16 ENCOUNTER — Encounter: Payer: Self-pay | Admitting: Obstetrics and Gynecology

## 2015-01-16 ENCOUNTER — Encounter: Payer: Medicaid Other | Admitting: Obstetrics and Gynecology

## 2015-01-16 ENCOUNTER — Ambulatory Visit (INDEPENDENT_AMBULATORY_CARE_PROVIDER_SITE_OTHER): Payer: Medicaid Other | Admitting: Obstetrics and Gynecology

## 2015-01-16 VITALS — BP 117/83 | HR 87 | Wt 148.0 lb

## 2015-01-16 DIAGNOSIS — Z720 Tobacco use: Secondary | ICD-10-CM

## 2015-01-16 DIAGNOSIS — Z3493 Encounter for supervision of normal pregnancy, unspecified, third trimester: Secondary | ICD-10-CM

## 2015-01-16 LAB — POCT URINALYSIS DIPSTICK
Bilirubin, UA: NEGATIVE
GLUCOSE UA: NEGATIVE
Ketone: NEGATIVE
Leukocytes, UA: NEGATIVE
NITRITE UA: NEGATIVE
Protein, UA: NEGATIVE
RBC UA: NEGATIVE
Spec Grav, UA: 1.01
UROBILINOGEN UA: 0.2
pH, UA: 8.5

## 2015-01-16 NOTE — Progress Notes (Signed)
No complaints. gbs and g/c- neg. Smoking cessation encouraged.While smoking 4-5 cigarettes a day, I have advised her regarding the negative impact of Tobacco on pregnancy.

## 2015-01-23 ENCOUNTER — Encounter: Payer: Self-pay | Admitting: Obstetrics and Gynecology

## 2015-01-23 ENCOUNTER — Ambulatory Visit (INDEPENDENT_AMBULATORY_CARE_PROVIDER_SITE_OTHER): Payer: Medicaid Other | Admitting: Obstetrics and Gynecology

## 2015-01-23 VITALS — BP 107/71 | HR 109 | Wt 152.6 lb

## 2015-01-23 DIAGNOSIS — O329XX0 Maternal care for malpresentation of fetus, unspecified, not applicable or unspecified: Secondary | ICD-10-CM

## 2015-01-23 DIAGNOSIS — Z3403 Encounter for supervision of normal first pregnancy, third trimester: Secondary | ICD-10-CM

## 2015-01-23 LAB — POCT URINALYSIS DIPSTICK
BILIRUBIN UA: NEGATIVE
Blood, UA: NEGATIVE
GLUCOSE UA: NEGATIVE
KETONES UA: NEGATIVE
LEUKOCYTES UA: NEGATIVE
Nitrite, UA: NEGATIVE
PROTEIN UA: NEGATIVE
Spec Grav, UA: 1.01
Urobilinogen, UA: 0.2
pH, UA: 6.5

## 2015-01-23 NOTE — Progress Notes (Signed)
Back pains x 2 d. Breech by Thayer OhmLeopold maneuvers; patient to report to labor and delivery 12/23 for external cephalic version.

## 2015-01-30 ENCOUNTER — Ambulatory Visit (INDEPENDENT_AMBULATORY_CARE_PROVIDER_SITE_OTHER): Payer: Medicaid Other | Admitting: Obstetrics and Gynecology

## 2015-01-30 VITALS — BP 106/70 | HR 87 | Wt 150.8 lb

## 2015-01-30 DIAGNOSIS — O321XX0 Maternal care for breech presentation, not applicable or unspecified: Secondary | ICD-10-CM

## 2015-01-30 DIAGNOSIS — Z3493 Encounter for supervision of normal pregnancy, unspecified, third trimester: Secondary | ICD-10-CM

## 2015-01-30 DIAGNOSIS — O26843 Uterine size-date discrepancy, third trimester: Secondary | ICD-10-CM

## 2015-01-30 LAB — POCT URINALYSIS DIPSTICK
BILIRUBIN UA: NEGATIVE
Blood, UA: NEGATIVE
Glucose, UA: NEGATIVE
Ketones, UA: NEGATIVE
LEUKOCYTES UA: NEGATIVE
NITRITE UA: NEGATIVE
PH UA: 7.5
PROTEIN UA: NEGATIVE
Spec Grav, UA: 1.01
Urobilinogen, UA: NEGATIVE

## 2015-01-30 NOTE — Progress Notes (Signed)
ROB: Patient denies complaints.  Did not report to L&D last week for cephalic version as previously scheduled.  Also noting significant size/dates discrepancy.  Limited unofficial sono performed today notes breech presentation (back up, appears to be frank), however EGA measuring at ~ 36 weeks, and AFI ~ 5 cm.  Will have patient scheduled for official sono tomorrow, based on findings, can determine further management.  Patient strongly desires to try version and avoid C-section if possible; if patient is still a candidate for version, will schedule for version and IOL, otherwise, will need to be scheduled for primary C-section in next several days.

## 2015-01-31 ENCOUNTER — Ambulatory Visit (INDEPENDENT_AMBULATORY_CARE_PROVIDER_SITE_OTHER): Payer: Medicaid Other

## 2015-01-31 DIAGNOSIS — O321XX0 Maternal care for breech presentation, not applicable or unspecified: Secondary | ICD-10-CM | POA: Diagnosis not present

## 2015-01-31 DIAGNOSIS — Z3493 Encounter for supervision of normal pregnancy, unspecified, third trimester: Secondary | ICD-10-CM

## 2015-01-31 DIAGNOSIS — O26843 Uterine size-date discrepancy, third trimester: Secondary | ICD-10-CM

## 2015-01-31 NOTE — Progress Notes (Addendum)
Addendum to prior note on 01/30/2015:  Patient s/p formal sono, which notes complete breech presentation, SGA fetus (measuring 36 weeks instead of 39 weeks, however EFW 28%), and oligohydramnios for gestational age of 506.9. Due to low fluid level, patient not a good candidate for version.  Will have patient scheduled for primary C-section for breech presentation.  Labor precautions given.  Scheduled for 02/04/2014 (no earlier elective dates available).   OB Ultrasound (01/31/2015):  Indications: Growth, presentation and AFI Findings:  Singleton intrauterine pregnancy is visualized with FHR at 133 BPM. Biometrics give an (U/S) Gestational age of [redacted] weeks and an (U/S) EDD of 02/28/15; this DOES NOT correlate with the clinically established EDD of 02/04/15.  Fetal presentation is complete breech.  EFW: 3117 grams ( 6 lbs. 14 oz. ). 28th percentile. Placenta: Right lateral, grade 3. AFI: Oligo at 6.9 cm.   Fetal stomach, bladder and kidneys seen and appear WNL.    Impression: 1. 36 week Viable Singleton Intrauterine pregnancy by U/S. EFW is in the 28th percentile. 2. (U/S) EDD is NOT consistent with Clinically established (LMP) EDD of 02/04/15. 3. Oligohydramnios at 6.9 cm for 40 weeks ( between the 2.5-5%). 4. Complete breech presentation.   Hildred LaserAnika Larue Lightner, MD [facility] 01/31/2015 5:38 PM

## 2015-01-31 NOTE — Addendum Note (Signed)
Addended by: Fabian NovemberHERRY, Lillee Mooneyhan S on: 01/31/2015 06:09 PM   Modules accepted: Orders

## 2015-02-02 NOTE — L&D Delivery Note (Addendum)
Delivery Summary for Yolanda CostainAnita Nash  Labor Events:   Preterm labor:   Rupture date:   Rupture time:   Rupture type:   Fluid Color:   Induction:   Augmentation:   Complications:   Cervical ripening:          Delivery:   Episiotomy:   Lacerations:   Repair suture:   Repair # of packets:   Blood loss (ml): 900   Information for the patient's newborn:  Lynnae PrudeLineberry, Girl English [[id number]][[id number]]    Delivery 02/05/2015 2:34 PM by  C-Section, Low Vertical Sex:  female Gestational Age: 2441w1d Delivery Clinician:  Hildred LaserAnika Lilyona Richner Living?: Yes        APGARS  One minute Five minutes Ten minutes  Skin color: 0   1      Heart rate: 2   2      Grimace: 2   2      Muscle tone: 2   2      Breathing: 2   2      Totals: 8  9      Presentation/position: Complete Breech     Resuscitation: None See NICU Consult (infant's chart)  Cord information: 3 vessels   Disposition of cord blood: No    Blood gases sent? No Complications: None  Placenta: Delivered: 02/05/2015 2:36 PM  Manual removal  Intact appearance Newborn Measurements: Weight: 6 lb 3.1 oz (2810 g)  Height: 20.08"  Head circumference:    Chest circumference:    Other providers: Delivery Nurse Transition RN Neonatologist Jillian C Maricle Casimer Laniusarolyn E Wanek John E Wimmer  Additional  information: Forceps:   Vacuum:   Breech:   Observed anomalies        See Cesarean section Procedure Note for details of procedure.    Hildred LaserAnika Ellah Otte, MD [facility]

## 2015-02-04 ENCOUNTER — Encounter
Admission: RE | Admit: 2015-02-04 | Discharge: 2015-02-04 | Disposition: A | Discharge status: Home / Self Care | Payer: Medicaid Other | Source: Ambulatory Visit | Attending: Obstetrics and Gynecology | Admitting: Obstetrics and Gynecology

## 2015-02-04 HISTORY — DX: Personal history of other mental and behavioral disorders: Z86.59

## 2015-02-04 HISTORY — DX: Anemia, unspecified: D64.9

## 2015-02-04 HISTORY — DX: Unspecified convulsions: R56.9

## 2015-02-04 HISTORY — DX: Gastro-esophageal reflux disease without esophagitis: K21.9

## 2015-02-04 LAB — TYPE AND SCREEN
ABO/RH(D): A POS
ANTIBODY SCREEN: NEGATIVE
Extend sample reason: UNDETERMINED

## 2015-02-04 LAB — CBC
HCT: 33.5 % — ABNORMAL LOW (ref 35.0–47.0)
HEMOGLOBIN: 11.4 g/dL — AB (ref 12.0–16.0)
MCH: 32.1 pg (ref 26.0–34.0)
MCHC: 33.9 g/dL (ref 32.0–36.0)
MCV: 94.5 fL (ref 80.0–100.0)
Platelets: 133 10*3/uL — ABNORMAL LOW (ref 150–440)
RBC: 3.54 MIL/uL — ABNORMAL LOW (ref 3.80–5.20)
RDW: 13.5 % (ref 11.5–14.5)
WBC: 10 10*3/uL (ref 3.6–11.0)

## 2015-02-04 LAB — RAPID HIV SCREEN (HIV 1/2 AB+AG)
HIV 1/2 ANTIBODIES: NONREACTIVE
HIV-1 P24 ANTIGEN - HIV24: NONREACTIVE

## 2015-02-04 LAB — ABO/RH: ABO/RH(D): A POS

## 2015-02-05 ENCOUNTER — Inpatient Hospital Stay: Payer: Medicaid Other | Admitting: Anesthesiology

## 2015-02-05 ENCOUNTER — Inpatient Hospital Stay
Admission: RE | Admit: 2015-02-05 | Discharge: 2015-02-08 | DRG: 766 | Disposition: A | Discharge status: Home / Self Care | Payer: Medicaid Other | Source: Ambulatory Visit | Attending: Obstetrics and Gynecology | Admitting: Obstetrics and Gynecology

## 2015-02-05 ENCOUNTER — Encounter
Admission: RE | Disposition: A | Discharge status: Home / Self Care | Payer: Self-pay | Source: Ambulatory Visit | Attending: Obstetrics and Gynecology

## 2015-02-05 DIAGNOSIS — N838 Other noninflammatory disorders of ovary, fallopian tube and broad ligament: Secondary | ICD-10-CM | POA: Diagnosis present

## 2015-02-05 DIAGNOSIS — O321XX0 Maternal care for breech presentation, not applicable or unspecified: Secondary | ICD-10-CM | POA: Diagnosis present

## 2015-02-05 DIAGNOSIS — O480 Post-term pregnancy: Secondary | ICD-10-CM | POA: Diagnosis present

## 2015-02-05 DIAGNOSIS — Z3493 Encounter for supervision of normal pregnancy, unspecified, third trimester: Secondary | ICD-10-CM | POA: Diagnosis not present

## 2015-02-05 DIAGNOSIS — D649 Anemia, unspecified: Secondary | ICD-10-CM | POA: Diagnosis present

## 2015-02-05 DIAGNOSIS — Z3A40 40 weeks gestation of pregnancy: Secondary | ICD-10-CM

## 2015-02-05 DIAGNOSIS — O34211 Maternal care for low transverse scar from previous cesarean delivery: Secondary | ICD-10-CM | POA: Diagnosis present

## 2015-02-05 DIAGNOSIS — O9902 Anemia complicating childbirth: Secondary | ICD-10-CM | POA: Diagnosis present

## 2015-02-05 DIAGNOSIS — O365930 Maternal care for other known or suspected poor fetal growth, third trimester, not applicable or unspecified: Secondary | ICD-10-CM | POA: Diagnosis present

## 2015-02-05 DIAGNOSIS — Z98891 History of uterine scar from previous surgery: Secondary | ICD-10-CM

## 2015-02-05 DIAGNOSIS — O4100X0 Oligohydramnios, unspecified trimester, not applicable or unspecified: Secondary | ICD-10-CM | POA: Diagnosis present

## 2015-02-05 LAB — URINE DRUG SCREEN, QUALITATIVE (ARMC ONLY)
AMPHETAMINES, UR SCREEN: NOT DETECTED
BENZODIAZEPINE, UR SCRN: NOT DETECTED
Barbiturates, Ur Screen: NOT DETECTED
CANNABINOID 50 NG, UR ~~LOC~~: NOT DETECTED
Cocaine Metabolite,Ur ~~LOC~~: NOT DETECTED
MDMA (ECSTASY) UR SCREEN: NOT DETECTED
Methadone Scn, Ur: NOT DETECTED
Opiate, Ur Screen: NOT DETECTED
Phencyclidine (PCP) Ur S: NOT DETECTED
TRICYCLIC, UR SCREEN: NOT DETECTED

## 2015-02-05 LAB — RPR: RPR Ser Ql: NONREACTIVE

## 2015-02-05 SURGERY — Surgical Case
Anesthesia: Spinal

## 2015-02-05 MED ORDER — FENTANYL CITRATE (PF) 100 MCG/2ML IJ SOLN
INTRAMUSCULAR | Status: AC
  Administered 2015-02-05: 50 ug via INTRAVENOUS
  Filled 2015-02-05: qty 2

## 2015-02-05 MED ORDER — DEXTROSE 5 % IV SOLN
2.0000 g | INTRAVENOUS | Status: AC
  Administered 2015-02-05: 2 g via INTRAVENOUS
  Filled 2015-02-05 (×2): qty 2

## 2015-02-05 MED ORDER — DIBUCAINE 1 % RE OINT: 1.0000 "application " | TOPICAL_OINTMENT | RECTAL | Status: DC | PRN

## 2015-02-05 MED ORDER — NALBUPHINE HCL 10 MG/ML IJ SOLN: 5.0000 mg | INTRAMUSCULAR | Status: DC | PRN

## 2015-02-05 MED ORDER — NALOXONE HCL 0.4 MG/ML IJ SOLN: 0.4000 mg | INTRAMUSCULAR | Status: DC | PRN

## 2015-02-05 MED ORDER — IBUPROFEN 600 MG PO TABS
600.0000 mg | ORAL_TABLET | Freq: Four times a day (QID) | ORAL | Status: DC
  Administered 2015-02-05 – 2015-02-08 (×11): 600 mg via ORAL
  Filled 2015-02-05 (×12): qty 1

## 2015-02-05 MED ORDER — LACTATED RINGERS IV SOLN
Freq: Once | INTRAVENOUS | Status: AC
  Administered 2015-02-05: 999 mL via INTRAVENOUS

## 2015-02-05 MED ORDER — KETOROLAC TROMETHAMINE 30 MG/ML IJ SOLN
INTRAMUSCULAR | Status: AC
  Administered 2015-02-05: 30 mg via INTRAVENOUS
  Filled 2015-02-05: qty 1

## 2015-02-05 MED ORDER — SIMETHICONE 80 MG PO CHEW
80.0000 mg | CHEWABLE_TABLET | ORAL | Status: DC | PRN
  Administered 2015-02-07: 80 mg via ORAL
  Filled 2015-02-05: qty 1

## 2015-02-05 MED ORDER — OXYCODONE-ACETAMINOPHEN 5-325 MG PO TABS
1.0000 | ORAL_TABLET | ORAL | Status: DC | PRN
Start: 2015-02-05 — End: 2015-02-08

## 2015-02-05 MED ORDER — MORPHINE SULFATE (PF) 0.5 MG/ML IJ SOLN
INTRAMUSCULAR | Status: DC | PRN
  Administered 2015-02-05: .1 mg via INTRATHECAL

## 2015-02-05 MED ORDER — LIDOCAINE 5 % EX PTCH
1.0000 | MEDICATED_PATCH | CUTANEOUS | Status: DC
  Administered 2015-02-05: 15:00:00 via TRANSDERMAL
  Administered 2015-02-07: 1 via TRANSDERMAL
  Filled 2015-02-05: qty 1

## 2015-02-05 MED ORDER — SIMETHICONE 80 MG PO CHEW
80.0000 mg | CHEWABLE_TABLET | ORAL | Status: DC
  Administered 2015-02-06 – 2015-02-07 (×3): 80 mg via ORAL
  Filled 2015-02-05 (×3): qty 1

## 2015-02-05 MED ORDER — OXYTOCIN 40 UNITS IN LACTATED RINGERS INFUSION - SIMPLE MED
2.5000 [IU]/h | INTRAVENOUS | Status: AC
  Filled 2015-02-05: qty 1000

## 2015-02-05 MED ORDER — PRENATAL MULTIVITAMIN CH
1.0000 | ORAL_TABLET | Freq: Every day | ORAL | Status: DC
  Administered 2015-02-06 – 2015-02-08 (×3): 1 via ORAL
  Filled 2015-02-05 (×3): qty 1

## 2015-02-05 MED ORDER — WITCH HAZEL-GLYCERIN EX PADS: 1.0000 "application " | MEDICATED_PAD | CUTANEOUS | Status: DC | PRN

## 2015-02-05 MED ORDER — ONDANSETRON HCL 4 MG/2ML IJ SOLN
INTRAMUSCULAR | Status: DC | PRN
  Administered 2015-02-05: 4 mg via INTRAVENOUS

## 2015-02-05 MED ORDER — NALBUPHINE HCL 10 MG/ML IJ SOLN: 5.0000 mg | Freq: Once | INTRAMUSCULAR | Status: DC | PRN

## 2015-02-05 MED ORDER — LACTATED RINGERS IV SOLN
INTRAVENOUS | Status: DC
  Administered 2015-02-05: 14:00:00 via INTRAVENOUS

## 2015-02-05 MED ORDER — FERROUS SULFATE 325 (65 FE) MG PO TABS
325.0000 mg | ORAL_TABLET | Freq: Two times a day (BID) | ORAL | Status: DC
  Administered 2015-02-06 – 2015-02-08 (×4): 325 mg via ORAL
  Filled 2015-02-05 (×4): qty 1

## 2015-02-05 MED ORDER — LACTATED RINGERS IV SOLN
INTRAVENOUS | Status: DC
  Administered 2015-02-06: 07:00:00 via INTRAVENOUS

## 2015-02-05 MED ORDER — OXYTOCIN 40 UNITS IN LACTATED RINGERS INFUSION - SIMPLE MED
INTRAVENOUS | Status: AC
  Administered 2015-02-05: 40 [IU]
  Filled 2015-02-05: qty 1000

## 2015-02-05 MED ORDER — MIDAZOLAM HCL 2 MG/2ML IJ SOLN
INTRAMUSCULAR | Status: DC | PRN
  Administered 2015-02-05: 0.5 mg via INTRAVENOUS
  Administered 2015-02-05: 2 mg via INTRAVENOUS
  Administered 2015-02-05: 1 mg via INTRAVENOUS

## 2015-02-05 MED ORDER — LANOLIN HYDROUS EX OINT
1.0000 | TOPICAL_OINTMENT | CUTANEOUS | Status: DC | PRN
Start: 2015-02-05 — End: 2015-02-08

## 2015-02-05 MED ORDER — FENTANYL CITRATE (PF) 100 MCG/2ML IJ SOLN
25.0000 ug | INTRAMUSCULAR | Status: DC | PRN
  Administered 2015-02-05 (×2): 50 ug via INTRAVENOUS

## 2015-02-05 MED ORDER — MENTHOL 3 MG MT LOZG: 1.0000 | LOZENGE | OROMUCOSAL | Status: DC | PRN

## 2015-02-05 MED ORDER — DIPHENHYDRAMINE HCL 50 MG/ML IJ SOLN: 12.5000 mg | INTRAMUSCULAR | Status: DC | PRN

## 2015-02-05 MED ORDER — OXYCODONE-ACETAMINOPHEN 5-325 MG PO TABS
2.0000 | ORAL_TABLET | ORAL | Status: DC | PRN
  Administered 2015-02-05 – 2015-02-08 (×17): 2 via ORAL
  Filled 2015-02-05 (×17): qty 2

## 2015-02-05 MED ORDER — ONDANSETRON HCL 4 MG/2ML IJ SOLN: 4.0000 mg | Freq: Three times a day (TID) | INTRAMUSCULAR | Status: DC | PRN

## 2015-02-05 MED ORDER — SODIUM CHLORIDE 0.9 % IJ SOLN
INTRAMUSCULAR | Status: AC
  Filled 2015-02-05: qty 3

## 2015-02-05 MED ORDER — MAGNESIUM HYDROXIDE 400 MG/5ML PO SUSP: 30.0000 mL | ORAL | Status: DC | PRN

## 2015-02-05 MED ORDER — LIDOCAINE 5 % EX PTCH
MEDICATED_PATCH | CUTANEOUS | Status: AC
  Filled 2015-02-05: qty 1

## 2015-02-05 MED ORDER — KETAMINE HCL 10 MG/ML IJ SOLN
INTRAMUSCULAR | Status: DC | PRN
  Administered 2015-02-05 (×2): 10 mg via INTRAVENOUS
  Administered 2015-02-05: 20 mg via INTRAVENOUS

## 2015-02-05 MED ORDER — LACTATED RINGERS IV SOLN: INTRAVENOUS | Status: DC

## 2015-02-05 MED ORDER — OXYTOCIN 40 UNITS IN LACTATED RINGERS INFUSION - SIMPLE MED
INTRAVENOUS | Status: AC
  Administered 2015-02-05: 50 mL via INTRAVENOUS
  Administered 2015-02-05: 800 mL via INTRAVENOUS
  Filled 2015-02-05: qty 1000

## 2015-02-05 MED ORDER — ACETAMINOPHEN 325 MG PO TABS: 650.0000 mg | ORAL_TABLET | ORAL | Status: DC | PRN

## 2015-02-05 MED ORDER — FENTANYL CITRATE (PF) 100 MCG/2ML IJ SOLN
INTRAMUSCULAR | Status: DC | PRN
  Administered 2015-02-05 (×3): 50 ug via INTRAVENOUS
  Administered 2015-02-05: 15 ug via INTRATHECAL
  Administered 2015-02-05: 50 ug via INTRAVENOUS

## 2015-02-05 MED ORDER — CEFAZOLIN SODIUM-DEXTROSE 2-3 GM-% IV SOLR
INTRAVENOUS | Status: AC
  Filled 2015-02-05: qty 50

## 2015-02-05 MED ORDER — DIPHENHYDRAMINE HCL 25 MG PO CAPS: 25.0000 mg | ORAL_CAPSULE | Freq: Four times a day (QID) | ORAL | Status: DC | PRN

## 2015-02-05 MED ORDER — SODIUM CHLORIDE 0.9 % IJ SOLN: 3.0000 mL | INTRAMUSCULAR | Status: DC | PRN

## 2015-02-05 MED ORDER — ZOLPIDEM TARTRATE 5 MG PO TABS: 5.0000 mg | ORAL_TABLET | Freq: Every evening | ORAL | Status: DC | PRN

## 2015-02-05 MED ORDER — PHENYLEPHRINE HCL 10 MG/ML IJ SOLN
INTRAMUSCULAR | Status: DC | PRN
  Administered 2015-02-05 (×2): 50 ug via INTRAVENOUS
  Administered 2015-02-05: 100 ug via INTRAVENOUS
  Administered 2015-02-05 (×3): 50 ug via INTRAVENOUS

## 2015-02-05 MED ORDER — BUPIVACAINE IN DEXTROSE 0.75-8.25 % IT SOLN
INTRATHECAL | Status: DC | PRN
  Administered 2015-02-05: 1.5 mL via INTRATHECAL

## 2015-02-05 MED ORDER — MEPERIDINE HCL 25 MG/ML IJ SOLN: 6.2500 mg | INTRAMUSCULAR | Status: DC | PRN

## 2015-02-05 MED ORDER — EPHEDRINE SULFATE 50 MG/ML IJ SOLN
INTRAMUSCULAR | Status: DC | PRN
  Administered 2015-02-05 (×2): 5 mg via INTRAVENOUS

## 2015-02-05 MED ORDER — KETOROLAC TROMETHAMINE 30 MG/ML IJ SOLN
30.0000 mg | Freq: Four times a day (QID) | INTRAMUSCULAR | Status: AC | PRN
  Administered 2015-02-05 – 2015-02-06 (×2): 30 mg via INTRAVENOUS
  Filled 2015-02-05: qty 1

## 2015-02-05 MED ORDER — NALOXONE HCL 2 MG/2ML IJ SOSY
1.0000 ug/kg/h | PREFILLED_SYRINGE | INTRAVENOUS | Status: DC | PRN
  Filled 2015-02-05: qty 2

## 2015-02-05 MED ORDER — KETOROLAC TROMETHAMINE 30 MG/ML IJ SOLN: 30.0000 mg | Freq: Four times a day (QID) | INTRAMUSCULAR | Status: AC | PRN

## 2015-02-05 MED ORDER — SENNOSIDES-DOCUSATE SODIUM 8.6-50 MG PO TABS
2.0000 | ORAL_TABLET | ORAL | Status: DC
  Administered 2015-02-06 – 2015-02-07 (×3): 2 via ORAL
  Filled 2015-02-05 (×3): qty 2

## 2015-02-05 MED ORDER — CITRIC ACID-SODIUM CITRATE 334-500 MG/5ML PO SOLN
30.0000 mL | ORAL | Status: AC
  Administered 2015-02-05: 30 mL via ORAL
  Filled 2015-02-05: qty 30

## 2015-02-05 MED ORDER — DIPHENHYDRAMINE HCL 25 MG PO CAPS: 25.0000 mg | ORAL_CAPSULE | ORAL | Status: DC | PRN

## 2015-02-05 SURGICAL SUPPLY — 26 items
BAG COUNTER SPONGE EZ (MISCELLANEOUS) ×2 IMPLANT
CANISTER SUCT 3000ML (MISCELLANEOUS) ×2 IMPLANT
CHLORAPREP W/TINT 26ML (MISCELLANEOUS) ×4 IMPLANT
DRSG TELFA 3X8 NADH (GAUZE/BANDAGES/DRESSINGS) ×2 IMPLANT
GAUZE SPONGE 4X4 12PLY STRL (GAUZE/BANDAGES/DRESSINGS) ×2 IMPLANT
GLOVE BIO SURGEON STRL SZ 6 (GLOVE) ×2 IMPLANT
GLOVE BIOGEL PI IND STRL 6.5 (GLOVE) ×1 IMPLANT
GLOVE BIOGEL PI INDICATOR 6.5 (GLOVE) ×1
GOWN STRL REUS W/ TWL LRG LVL3 (GOWN DISPOSABLE) ×2 IMPLANT
GOWN STRL REUS W/TWL LRG LVL3 (GOWN DISPOSABLE) ×2
KIT RM TURNOVER STRD PROC AR (KITS) ×2 IMPLANT
MARKER SKIN W/RULER 31145785 (MISCELLANEOUS) ×2 IMPLANT
NS IRRIG 1000ML POUR BTL (IV SOLUTION) ×2 IMPLANT
PACK C SECTION AR (MISCELLANEOUS) ×2 IMPLANT
PAD GROUND ADULT SPLIT (MISCELLANEOUS) ×2 IMPLANT
PAD OB MATERNITY 4.3X12.25 (PERSONAL CARE ITEMS) ×2 IMPLANT
PAD PREP 24X41 OB/GYN DISP (PERSONAL CARE ITEMS) ×2 IMPLANT
SPONGE LAP 18X18 5 PK (GAUZE/BANDAGES/DRESSINGS) ×2 IMPLANT
STRIP CLOSURE SKIN 1/2X4 (GAUZE/BANDAGES/DRESSINGS) ×2 IMPLANT
SUT CHROMIC 0 CT 1 (SUTURE) IMPLANT
SUT MNCRL AB 4-0 PS2 18 (SUTURE) ×2 IMPLANT
SUT PLAIN 2 0 XLH (SUTURE) IMPLANT
SUT VIC AB 0 CT1 36 (SUTURE) ×4 IMPLANT
SUT VIC AB 1 CT1 36 (SUTURE) ×4 IMPLANT
SUT VIC AB 3-0 SH 27 (SUTURE) ×1
SUT VIC AB 3-0 SH 27X BRD (SUTURE) ×1 IMPLANT

## 2015-02-05 NOTE — Transfer of Care (Signed)
Immediate Anesthesia Transfer of Care Note  Patient: Yolanda Nash  Procedure(s) Performed: Procedure(s): CESAREAN SECTION (N/A)  Patient Location: PACU  Anesthesia Type:Spinal  Level of Consciousness: awake  Airway & Oxygen Therapy: Patient Spontanous Breathing and Patient connected to nasal cannula oxygen  Post-op Assessment: Report given to RN and Post -op Vital signs reviewed and stable  Post vital signs: Reviewed and stable  Last Vitals:  Filed Vitals:   02/05/15 1211 02/05/15 1212  BP: 112/75 112/75  Pulse: 93   Temp: 36.9 C   Resp: 18     Complications: No apparent anesthesia complications
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
## 2015-02-05 NOTE — Anesthesia Procedure Notes (Signed)
Spinal Patient location during procedure: OR Staffing Anesthesiologist: Martha Clan Resident/CRNA: Demetrius Charity Performed by: resident/CRNA  Preanesthetic Checklist Completed: patient identified, site marked, surgical consent, pre-op evaluation, timeout performed, IV checked, risks and benefits discussed and monitors and equipment checked Spinal Block Patient position: sitting Prep: Betadine Patient monitoring: heart rate, continuous pulse ox, blood pressure and cardiac monitor Approach: midline Location: L3-4 Injection technique: single-shot Needle Needle type: Whitacre and Introducer  Needle gauge: 25 G Needle length: 9 cm Assessment Sensory level: T4 Additional Notes Negative paresthesia. Negative blood return. Positive free-flowing CSF. Expiration date of kit checked and confirmed. Patient tolerated procedure well, without complications.

## 2015-02-05 NOTE — Lactation Note (Signed)
This note was copied from the chart of Yolanda Miles Costainnita Gieske. Lactation Consultation Note  Patient Name: Yolanda Nash NGEXB'MToday's Date: 02/05/2015 Reason for consult: Initial assessment   Maternal Data Formula Feeding for Exclusion: No Mom states she has breast fed her other babies for 3-8 months each before without any problems. She positioned and latched this baby without assist. Mom has very low pain tolerance and is crying while nursing from abdominal pain. Denies nursing pain. I did basic teaching about diet (at least 500 cal extra per day due to high metabolism of breastfeeding; no special food restrictions, etc); contraindications of street drugs or more than one occssional drink of alcohol (pump dump if more than that); [facility] including support group.   Feeding  Vigorous nursing with rhythmic suck/swallow pattern Feeding Type: Breast Fed Length of feed: 20 min  LATCH Score/Interventions Latch: Grasps breast easily, tongue down, lips flanged, rhythmical sucking.  Audible Swallowing: A few with stimulation Intervention(s): Alternate breast massage  Type of Nipple: Everted at rest and after stimulation  Comfort (Breast/Nipple): Soft / non-tender     Hold (Positioning): No assistance needed to correctly position infant at breast.  LATCH Score: 9  Lactation Tools Discussed/Used     Consult Status      Sunday CornSandra Clark Yolanda Nash 02/05/2015, 4:42 PM
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
## 2015-02-05 NOTE — Op Note (Signed)
Cesarean Section Procedure Note  Indications: malpresentation: complete breech  Pre-operative Diagnosis: 40 week 2 day pregnancy, oligohydramnios, suspected SGA infant.  Post-operative Diagnosis: same  Surgeon: Hildred LaserAnika Kristoffer Bala, MD  Assistants: Ulis RiasJamie Doman, Surgical Scrub Tech  Procedure: Primary/Repeat low transverse Cesarean Section  Anesthesia: Spinal anesthesia  ASA Class: II  Procedure Details: The patient was seen in the Holding Room. The risks, benefits, complications, treatment options, and expected outcomes were discussed with the patient.  The patient concurred with the proposed plan, giving informed consent.  The site of surgery properly noted/marked. The patient was taken to the Operating Room, identified as Yolanda Nash and the procedure verified as C-Section Delivery. A Time Out was held and the above information confirmed.  After induction of anesthesia, the patient was draped and prepped in the usual sterile manner. Anesthesia was tested and noted to be adequate. A Pfannenstiel incision was made and carried down through the subcutaneous tissue to the fascia. Fascial incision was made and extended transversely. The fascia was separated from the underlying rectus tissue superiorly and inferiorly. The peritoneum was identified and entered. Peritoneal incision was extended longitudinally. The utero-vesical peritoneal reflection was incised transversely and the bladder flap was bluntly freed from the lower uterine segment. A low transverse uterine incision was made. Delivered from breech (complete) presentation was a 2810 gram Female with Apgar scores of 8 at one minute and 9 at five minutes.After the umbilical cord was clamped and cut cord blood was obtained for evaluation. The placenta was removed intact and appeared normal. The uterus was exteriorized and cleared of all clots and debris. The uterine outline, tubes and ovaries appeared normal.  Small paratubal cyst was noted on  right fimbriated edge, was cauterized and removed using the bovie. The uterine incision was closed with running locked sutures of 0-Vicryl.  A second suture of 0-Vicryl was used in an imbricating layer.  Several figure of eight stitches were placed along the incision line to achieve hemostasis. Hemostasis was observed. Lavage was carried out until clear. The fascia was then reapproximated with a running suture of 1-0 Vicryl. The skin was reapproximated with 3-0 Monocryl.  Instrument, sponge, and needle counts were correct prior the abdominal closure and at the conclusion of the case.   Findings: Female infant, complete breech presentation, 2810 grams, with Apgar scores of 8 at one minute and 9 at five minutes. Intact placenta with 3 vessel cord.  The uterine outline, tubes and ovaries appeared normal. Small right paratubal cyst.  Estimated Blood Loss:  900 ml      Drains: foley catheter to gravity drainage, 150 clear urine at end of the procedure         Total IV Fluids:  1200 ml  Specimens: Placenta and Paratubal cyst.  Disposition:  Sent to Pathology.           Implants: None         Complications:  None; patient tolerated the procedure well.         Disposition: PACU - hemodynamically stable.         Condition: stable   Hildred LaserAnika Anniyah Mood, MD [facility]

## 2015-02-06 ENCOUNTER — Encounter: Payer: Self-pay | Admitting: Obstetrics and Gynecology

## 2015-02-06 LAB — CBC
HCT: 27.2 % — ABNORMAL LOW (ref 35.0–47.0)
Hemoglobin: 9.3 g/dL — ABNORMAL LOW (ref 12.0–16.0)
MCH: 32.8 pg (ref 26.0–34.0)
MCHC: 34.2 g/dL (ref 32.0–36.0)
MCV: 96 fL (ref 80.0–100.0)
PLATELETS: 115 10*3/uL — AB (ref 150–440)
RBC: 2.83 MIL/uL — AB (ref 3.80–5.20)
RDW: 13.6 % (ref 11.5–14.5)
WBC: 11.5 10*3/uL — AB (ref 3.6–11.0)

## 2015-02-06 NOTE — Anesthesia Post-op Follow-up Note (Signed)
  Anesthesia Pain Follow-up Note  Patient: Yolanda Nash  Day #: 1  Date of Follow-up: 02/06/2015 Time: 7:27 AM  Last Vitals:  Filed Vitals:   02/06/15 0045 02/06/15 0427  BP: 102/85 105/52  Pulse:  73  Temp: 36.4 C   Resp: 20     Level of Consciousness: alert  Pain: none   Side Effects:None  Catheter Site Exam:clean, dry, no drainage  Plan: Continue current therapy  Raynah Gomes,  Sheran FavaMark R

## 2015-02-06 NOTE — Progress Notes (Signed)
Postpartum Day #1: Cesarean Delivery (primary)  Subjective: Patient reports incisional pain, tolerating PO and no problems voiding.  Denies passage of flatus.  Objective: Vital signs in last 24 hours: Temp:  [97.5 F (36.4 C)-98.4 F (36.9 C)] 98.4 F (36.9 C) (01/05 0750) Pulse Rate:  [69-93] 75 (01/05 0750) Resp:  [18-24] 18 (01/05 0750) BP: (102-154)/(52-87) 134/65 mmHg (01/05 0750) SpO2:  [96 %-100 %] 100 % (01/05 0427) Weight:  [150 lb (68.04 kg)] 150 lb (68.04 kg) (01/05 0700)  Physical Exam:  General: alert and no distress  CVS: regular rate and rhythm, no murmurs Resp: clear to auscultation Lochia: appropriate Uterine Fundus: firm Incision: pressure dressing applied. DVT Evaluation: No evidence of DVT seen on physical exam. Negative Homan's sign. No cords or calf tenderness.   Recent Labs  02/04/15 1129 02/06/15 0703  HGB 11.4* 9.3*  HCT 33.5* 27.2*    Assessment/Plan: Status post Cesarean section. Doing well postoperatively.  Continue current care. Mild anemia - will supplement with PO iron tablets daily. Breastfeeding  Yolanda Nash 02/06/2015, 8:29 AM

## 2015-02-06 NOTE — Anesthesia Postprocedure Evaluation (Signed)
Anesthesia Post Note  Patient: Yolanda Nash  Procedure(s) Performed: Procedure(s) (LRB): CESAREAN SECTION (N/A)  Patient location during evaluation: Mother Baby Anesthesia Type: Epidural Level of consciousness: awake, awake and alert, oriented and patient cooperative Pain management: satisfactory to patient Vital Signs Assessment: vitals unstable and post-procedure vital signs reviewed and stable Respiratory status: spontaneous breathing, respiratory function stable and nonlabored ventilation Cardiovascular status: blood pressure returned to baseline and stable Postop Assessment: no headache and no backache Anesthetic complications: no    Last Vitals:  Filed Vitals:   02/06/15 0045 02/06/15 0427  BP: 102/85 105/52  Pulse:  73  Temp: 36.4 C   Resp: 20     Last Pain:  Filed Vitals:   02/06/15 0719  PainSc: 9                  Aryani Daffern,  Laiden Milles R

## 2015-02-07 LAB — SURGICAL PATHOLOGY

## 2015-02-07 MED ORDER — ALUM & MAG HYDROXIDE-SIMETH 200-200-20 MG/5ML PO SUSP
30.0000 mL | ORAL | Status: DC | PRN
  Filled 2015-02-07: qty 30

## 2015-02-07 MED ORDER — FAMOTIDINE 20 MG PO TABS
20.0000 mg | ORAL_TABLET | Freq: Two times a day (BID) | ORAL | Status: DC
  Administered 2015-02-07 – 2015-02-08 (×2): 20 mg via ORAL
  Filled 2015-02-07 (×2): qty 1

## 2015-02-07 MED ORDER — ONDANSETRON HCL 4 MG PO TABS
4.0000 mg | ORAL_TABLET | Freq: Three times a day (TID) | ORAL | Status: DC | PRN
  Administered 2015-02-07 (×2): 4 mg via ORAL
  Filled 2015-02-07 (×2): qty 1
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
## 2015-02-07 NOTE — Progress Notes (Signed)
Postpartum Day #2: Cesarean Delivery (primary)  Subjective: Patient reports incisional pain, tolerating PO and no problems voiding.  Reports passage of flatus.  Objective: Vital signs in last 24 hours: Temp:  [98.1 F (36.7 C)-98.7 F (37.1 C)] 98.7 F (37.1 C) (01/06 1136) Pulse Rate:  [70-73] 70 (01/06 0740) Resp:  [18-20] 18 (01/06 0740) BP: (96-111)/(63-89) 96/63 mmHg (01/06 0740)  Physical Exam:  General: alert and no distress  CVS: regular rate and rhythm, no murmurs Resp: clear to auscultation Lochia: appropriate Uterine Fundus: firm Incision: pressure dressing applied. DVT Evaluation: No evidence of DVT seen on physical exam. Negative Homan's sign. No cords or calf tenderness.   CBC Latest Ref Rng 02/06/2015 02/04/2015  WBC 3.6 - 11.0 K/uL 11.5(H) 10.0  Hemoglobin 12.0 - 16.0 g/dL 3.2(G9.3(L) 11.4(L)  Hematocrit 35.0 - 47.0 % 27.2(L) 33.5(L)  Platelets 150 - 440 K/uL 115(L) 133(L)     Assessment/Plan: Status post Cesarean section. Doing well postoperatively.  Continue current care. Mild anemia - will supplement with PO iron tablets daily. Breastfeeding Likely d/c home tomorrow.    Hildred LaserAnika Alletta Mattos, MD [facility]

## 2015-02-08 MED ORDER — DOCUSATE SODIUM 100 MG PO CAPS: 100.0000 mg | ORAL_CAPSULE | Freq: Two times a day (BID) | ORAL | Status: AC | PRN

## 2015-02-08 MED ORDER — IBUPROFEN 800 MG PO TABS: 800.0000 mg | ORAL_TABLET | Freq: Three times a day (TID) | ORAL | Status: AC | PRN

## 2015-02-08 MED ORDER — OXYCODONE-ACETAMINOPHEN 5-325 MG PO TABS: 1.0000 | ORAL_TABLET | ORAL | Status: AC | PRN

## 2015-02-08 MED ORDER — FERROUS SULFATE 325 (65 FE) MG PO TABS: 325.0000 mg | ORAL_TABLET | Freq: Two times a day (BID) | ORAL | Status: AC

## 2015-02-08 NOTE — Discharge Instructions (Signed)
Cesarean Delivery, Care After Refer to this sheet in the next few weeks. These instructions provide you with information on caring for yourself after your procedure. Your health care provider may also give you specific instructions. Your treatment has been planned according to current medical practices, but problems sometimes occur. Call your health care provider if you have any problems or questions after you go home. HOME CARE INSTRUCTIONS  Only take over-the-counter or prescription medications as directed by your health care provider.  Do not drink alcohol, especially if you are breastfeeding or taking medication to relieve pain.  Do not chew or smoke tobacco.  Continue to use good perineal care. Good perineal care includes:  Wiping your perineum from front to back.  Keeping your perineum clean.  Check your surgical cut (incision) daily for increased redness, drainage, swelling, or separation of skin.  Clean your incision gently with soap and water every day, and then pat it dry. If your health care provider says it is okay, leave the incision uncovered. Use a bandage (dressing) if the incision is draining fluid or appears irritated. If the adhesive strips across the incision do not fall off within 7 days, carefully peel them off.  Hug a pillow when coughing or sneezing until your incision is healed. This helps to relieve pain.  Do not use tampons or douche for the next 6 weeks.   Showers only, no tub baths for the next 6 weeks.  Wear a well-fitting bra that provides breast support.  Limit wearing support panties or control-top hose.  Drink enough fluids to keep your urine clear or pale yellow.  Eat high-fiber foods such as whole grain cereals and breads, brown rice, beans, and fresh fruits and vegetables every day. These foods may help prevent or relieve constipation.  Resume activities such as climbing stairs, driving, lifting, exercising, or traveling as directed by your  health care provider.  No sexual intercourse for at least the next 6 weeks. After that, wait until you feel ready.   Try to have someone help you with your household activities and your newborn for at least a few days after you leave the hospital.  Rest as much as possible. Try to rest or take a nap when your newborn is sleeping.  Increase your activities gradually.  Keep all of your scheduled postpartum appointments. It is very important to keep your scheduled follow-up appointments. At these appointments, your health care provider will be checking to make sure that you are healing physically and emotionally. SEEK MEDICAL CARE IF:   You are passing large clots from your vagina. Save any clots to show your health care provider.  You have a foul smelling discharge from your vagina.  You have trouble urinating.  You are urinating frequently.  You have pain when you urinate.  You have a change in your bowel movements.  You have increasing redness, pain, or swelling near your incision.  You have pus draining from your incision.  Your incision is separating.  You have painful, hard, or reddened breasts.  You have a severe headache.  You have blurred vision or see spots.  You feel sad or depressed.  You have thoughts of hurting yourself or your newborn.  You have questions about your care, the care of your newborn, or medications.  You are dizzy or light-headed.  You have a rash.  You have pain, redness, or swelling at the site of the removed intravenous access (IV) tube.  You have nausea or vomiting.  You stopped breastfeeding and have not had a menstrual period within 12 weeks of stopping.  You are not breastfeeding and have not had a menstrual period within 12 weeks of delivery.  You have a fever. SEEK IMMEDIATE MEDICAL CARE IF:  You have persistent pain.  You have chest pain.  You have shortness of breath.  You faint.  You have leg pain.  You have  stomach pain.  Your vaginal bleeding saturates 2 or more sanitary pads in 1 hour. MAKE SURE YOU:   Understand these instructions.  Will watch your condition.  Will get help right away if you are not doing well or get worse.   This information is not intended to replace advice given to you by your health care provider. Make sure you discuss any questions you have with your health care provider.   Document Released: 10/10/2001 Document Revised: 02/08/2014 Document Reviewed: 09/15/2011 Elsevier Interactive Patient Education [employer]

## 2015-02-08 NOTE — Progress Notes (Signed)
Postpartum Day #3: Cesarean Delivery (primary)  Subjective: Patient reports incisional pain, tolerating PO and no problems voiding.  Reports passage of flatus.  Objective: Vital signs in last 24 hours: Temp:  [98.2 F (36.8 C)-99 F (37.2 C)] 98.5 F (36.9 C) (01/07 1304) Pulse Rate:  [49-75] 67 (01/07 1304) Resp:  [16-20] 16 (01/07 1304) BP: (98-107)/(56-75) 107/65 mmHg (01/07 1304) SpO2:  [99 %-100 %] 100 % (01/07 1304)  Physical Exam:  General: alert and no distress  CVS: regular rate and rhythm, no murmurs Resp: clear to auscultation Lochia: appropriate Uterine Fundus: firm Incision: pressure dressing applied. DVT Evaluation: No evidence of DVT seen on physical exam. Negative Homan's sign. No cords or calf tenderness.   CBC Latest Ref Rng 02/06/2015 02/04/2015  WBC 3.6 - 11.0 K/uL 11.5(H) 10.0  Hemoglobin 12.0 - 16.0 g/dL 2.9(F9.3(L) 11.4(L)  Hematocrit 35.0 - 47.0 % 27.2(L) 33.5(L)  Platelets 150 - 440 K/uL 115(L) 133(L)     Assessment/Plan: Status post Cesarean section. Doing well postoperatively.  Continue current care. Mild anemia - will supplement with PO iron tablets daily. Breastfeeding D/c home today.   Hildred LaserAnika Jeny Nield, MD [facility]

## 2015-02-08 NOTE — Progress Notes (Signed)
Patient discharged home with infant. Vital signs stable, bleeding within normal limits, uterus firm. Discharge instructions, prescriptions, and follow up appointment given to and reviewed with patient. Patient verbalized understanding, all questions answered. Escorted in wheelchair by nursing.    

## 2015-02-08 NOTE — Discharge Summary (Signed)
Obstetric Discharge Summary Reason for Admission: cesarean section for breech presentation Prenatal Procedures: ultrasound Intrapartum Procedures: cesarean: low cervical, transverse Postpartum Procedures: none Complications-Operative and Postpartum: none HEMOGLOBIN  Date Value Ref Range Status  02/06/2015 9.3* 12.0 - 16.0 g/dL Final  09/81/191406/29/2016 78.212.7 g/dL Final   HGB  Date Value Ref Range Status  12/10/2012 13.8 12.0-16.0 g/dL Final   HCT  Date Value Ref Range Status  02/06/2015 27.2* 35.0 - 47.0 % Final  07/31/2014 38 % Final  12/10/2012 39.3 35.0-47.0 % Final   HEMATOCRIT  Date Value Ref Range Status  12/13/2014 32.1* 34.0 - 46.6 % Final    Physical Exam:  General: alert and no distress Lochia: appropriate Uterine Fundus: firm Incision: healing well, no significant drainage, no dehiscence, no significant erythema DVT Evaluation: No evidence of DVT seen on physical exam. Negative Homan's sign. No cords or calf tenderness. No significant calf/ankle edema.  Discharge Diagnoses: Term Pregnancy-delivered, Post-date pregnancy and Mild anemia  Discharge Information: Date: 02/08/2015 Activity: pelvic rest x 6 weeks Diet: routine Medications: PNV, Ibuprofen, Colace, Iron and Percocet Condition: stable Instructions: refer to practice specific booklet Discharge to: home Follow-up Information    Follow up with Hildred LaserAnika Amor Packard, MD In 1 week.   Specialties:  Obstetrics and Gynecology, Radiology   Why:  For wound re-check   Contact information:   [address] [place] [id number] [phone]       Newborn Data: Live born female  Birth Weight: 6 lb 3.1 oz (2810 g) APGAR: 8, 9  Home with mother.  Hildred Lasernika Lacie Landry 02/08/2015, 2:11 PM

## 2015-02-11 ENCOUNTER — Telehealth: Payer: Self-pay | Admitting: Obstetrics and Gynecology

## 2015-02-11 NOTE — Telephone Encounter (Signed)
Spoke with pt she states that she is still having "some pain" advised pt that we do not typically refill narcotic pain medications for postpartum mothers. Advised pt on the use of tylenol and motrin, also offered pt to be seen by a provider, however pt declined as she just wants a prescription.

## 2015-02-11 NOTE — Telephone Encounter (Signed)
Needs oxycodone refilled/ pt in pain

## 2015-02-14 ENCOUNTER — Encounter: Payer: Medicaid Other | Admitting: Obstetrics and Gynecology

## 2015-04-23 ENCOUNTER — Ambulatory Visit: Payer: Medicaid Other | Admitting: Obstetrics and Gynecology

## 2015-05-17 IMAGING — US US RENAL KIDNEY
1 series · 14 of 25 positions shown · non-contrast
Comparison: None.

CLINICAL DATA: Right flank pain.

EXAM:
RENAL/URINARY TRACT ULTRASOUND COMPLETE

[Series 1: us renal kidney · 0.23mm/px · 14 of 32 slices shown]
[im 1/32]
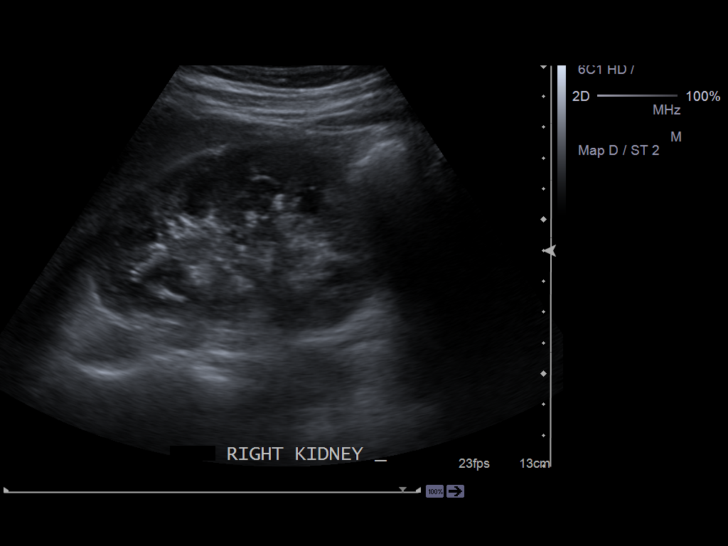
[im 3/32]
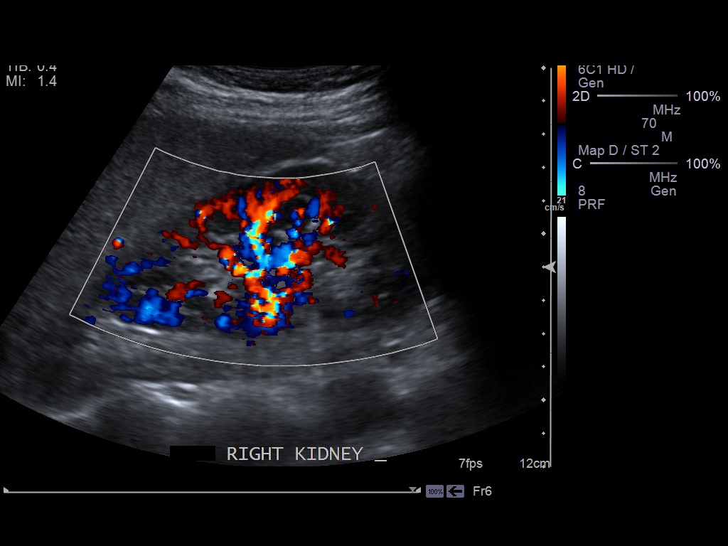
[im 6/32]
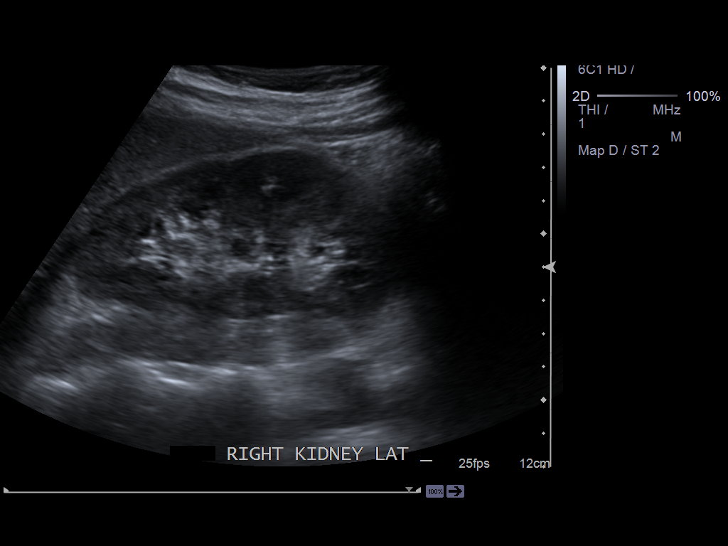
[im 8/32]
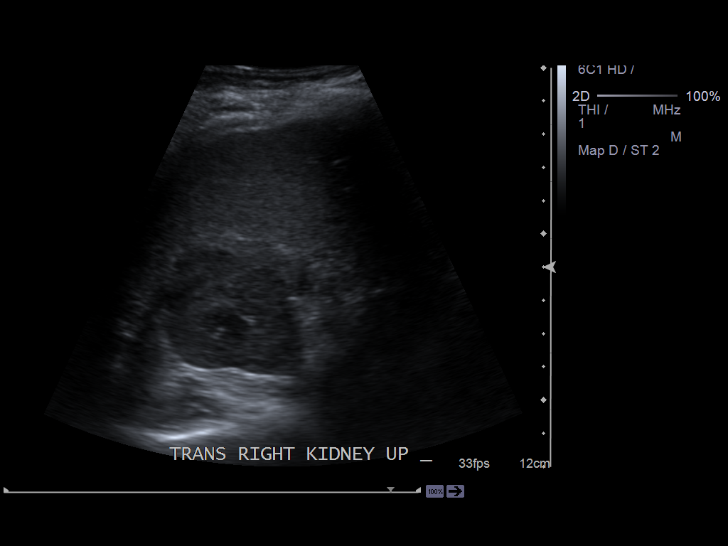
[im 11/32]
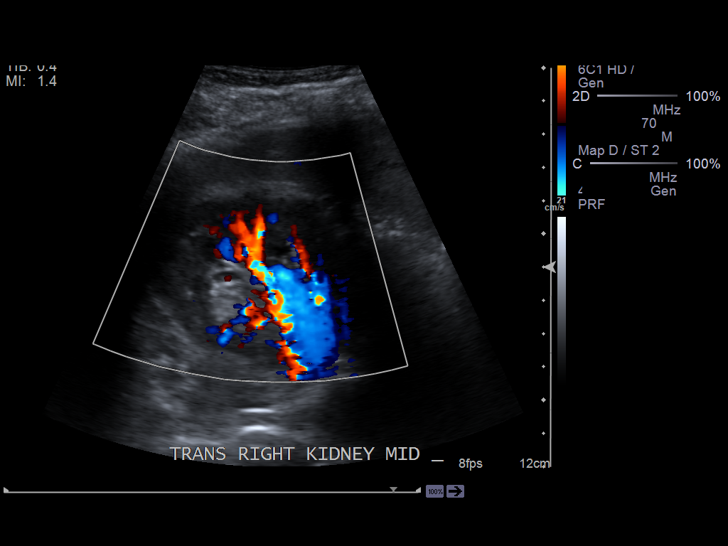
[im 12/32]
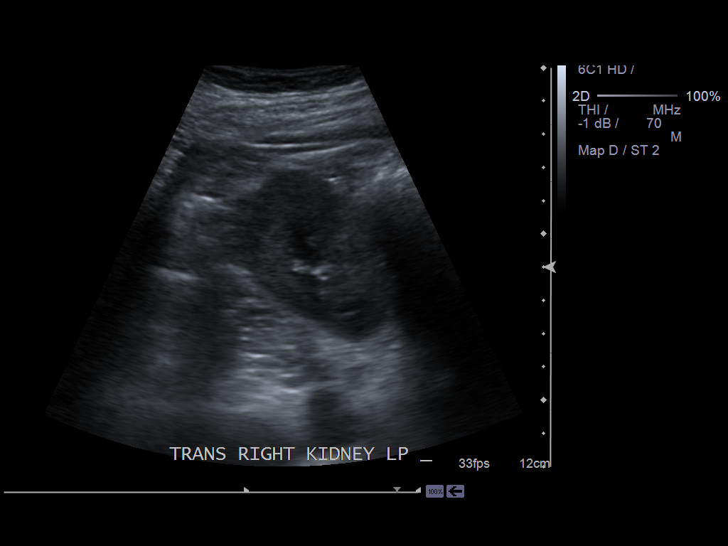
[im 15/32]
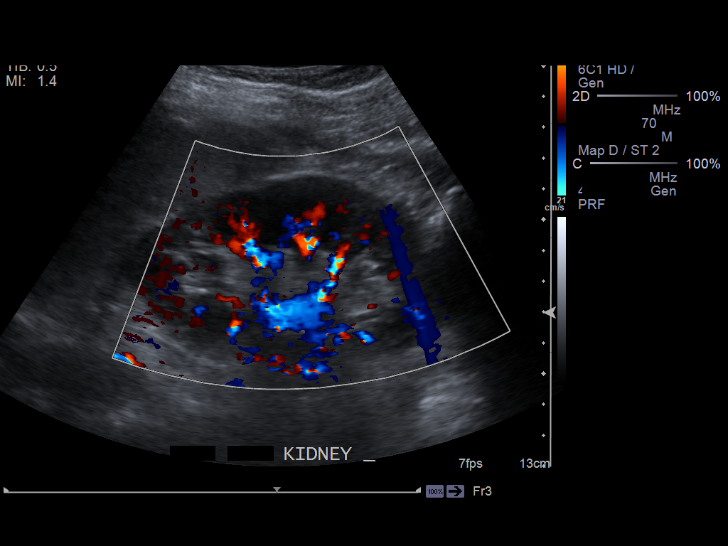
[im 17/32]
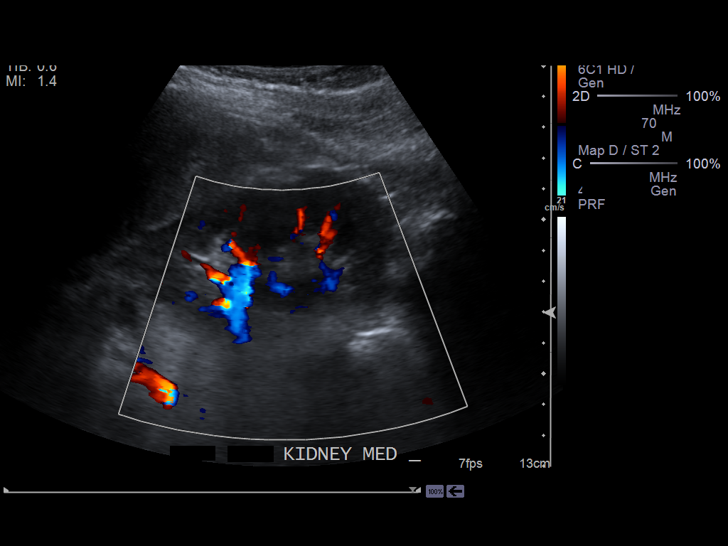
[im 20/32]
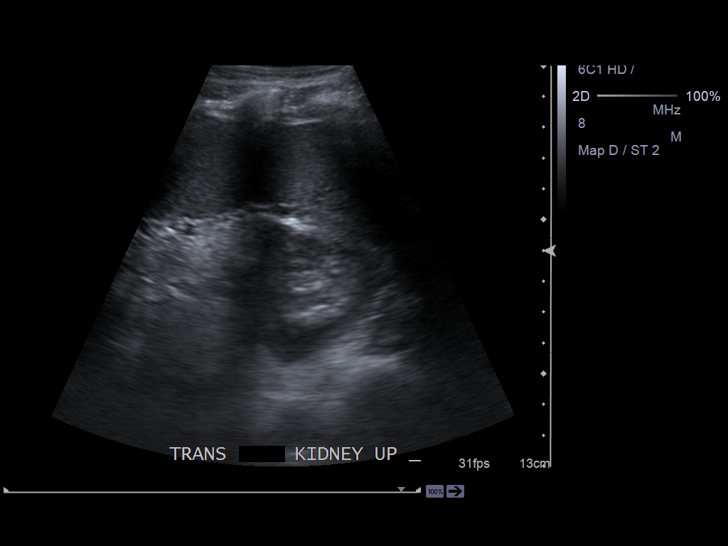
[im 21/32]
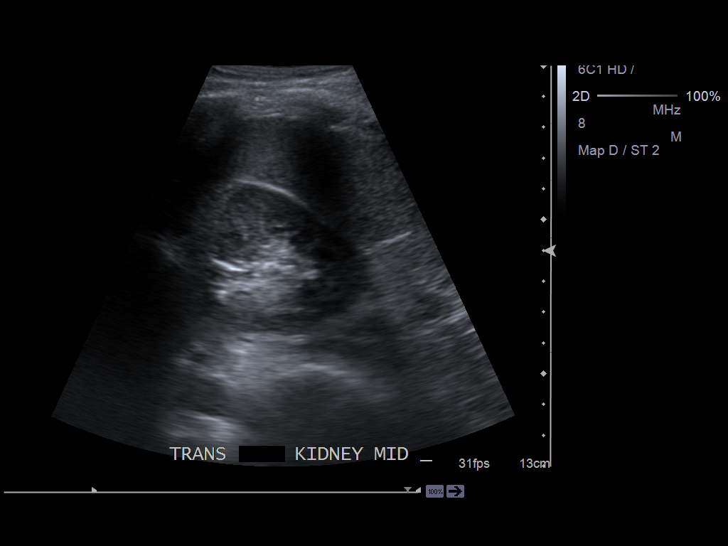
[im 24/32]
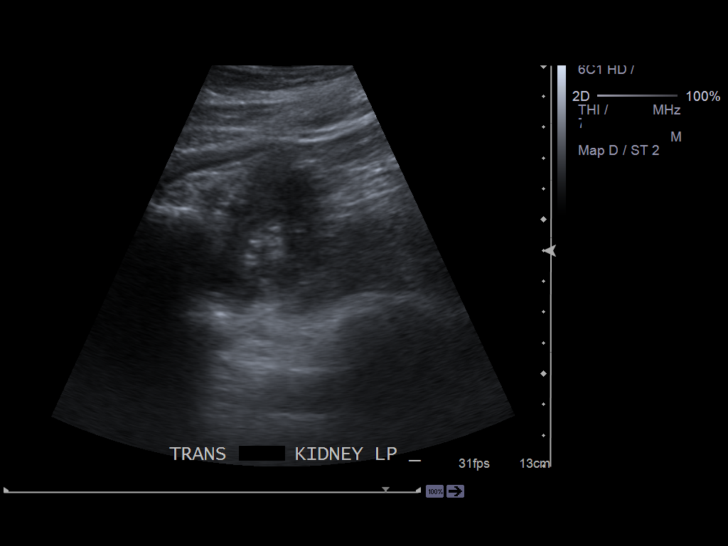
[im 26/32]
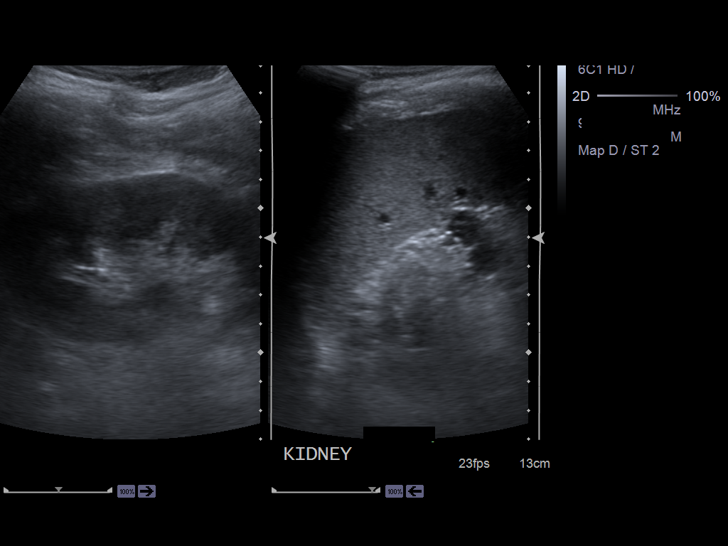
[im 29/32]
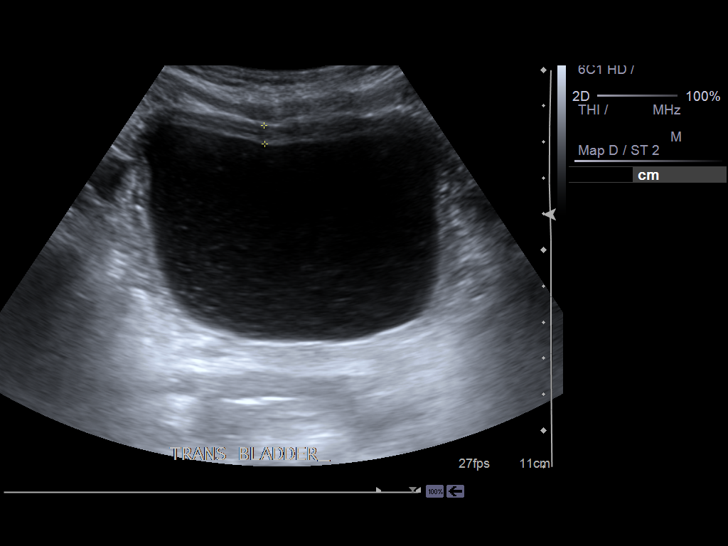
[im 32/32]
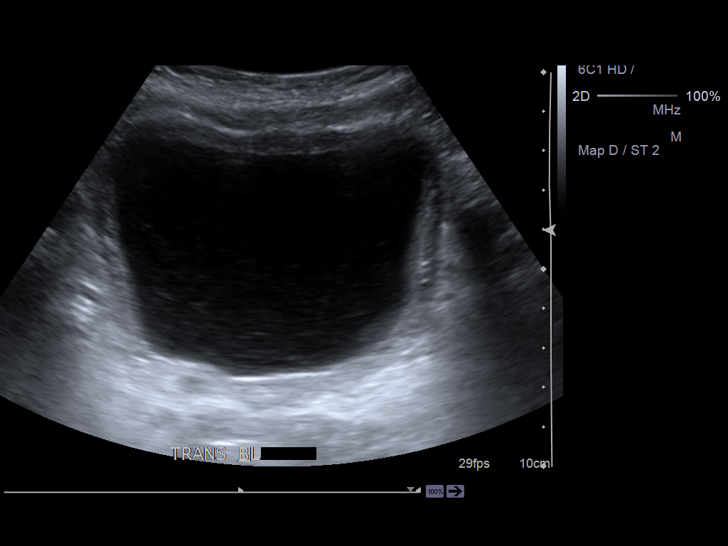

[14 of 25 positions shown; findings below may reference images not displayed]

FINDINGS: Right Kidney

Length: 9.6 cm. Normal echogenicity and renal cortical thickness. No
hydronephrosis. No definite renal calculi. There does appear to be
slightly increased blood flow in the right kidney. Could not exclude
pyelonephritis.

Left Kidney

Length: 10.3 cm. Normal echogenicity and renal cortical thickness.
No hydronephrosis. No renal calculi.

Bladder

Mild diffuse bladder wall thickening but no focal mass. There
appears to be debris in the bladder. Bilateral ureteral jets noted.
IMPRESSION: No hydronephrosis or obstructing ureteral calculi.

Increased blood flow in the right kidney could suggest
pyelonephritis.

Mild bladder wall thickening and suspect some echogenic debris.
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
## 2018-03-04 DEATH — deceased
